# Patient Record
Sex: Female | Born: 1985 | Race: Black or African American | Hispanic: No | Marital: Single | Smoking: Current some day smoker
Health system: Southern US, Community
[De-identification: ages and names within clinical notes are randomized; demographics above are authoritative.]

## PROBLEM LIST (undated history)

## (undated) DIAGNOSIS — F32A Depression, unspecified: Secondary | ICD-10-CM

## (undated) DIAGNOSIS — K219 Gastro-esophageal reflux disease without esophagitis: Secondary | ICD-10-CM

## (undated) DIAGNOSIS — R635 Abnormal weight gain: Secondary | ICD-10-CM

## (undated) DIAGNOSIS — D508 Other iron deficiency anemias: Secondary | ICD-10-CM

## (undated) DIAGNOSIS — R569 Unspecified convulsions: Secondary | ICD-10-CM

## (undated) DIAGNOSIS — R51 Headache: Secondary | ICD-10-CM

## (undated) DIAGNOSIS — R519 Headache, unspecified: Secondary | ICD-10-CM

## (undated) DIAGNOSIS — R011 Cardiac murmur, unspecified: Secondary | ICD-10-CM

## (undated) DIAGNOSIS — B999 Unspecified infectious disease: Secondary | ICD-10-CM

## (undated) DIAGNOSIS — F329 Major depressive disorder, single episode, unspecified: Secondary | ICD-10-CM

## (undated) DIAGNOSIS — D649 Anemia, unspecified: Secondary | ICD-10-CM

## (undated) HISTORY — DX: Cardiac murmur, unspecified: R01.1

## (undated) HISTORY — DX: Other iron deficiency anemias: D50.8

## (undated) HISTORY — DX: Abnormal weight gain: R63.5

## (undated) HISTORY — DX: Anemia, unspecified: D64.9

---
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
## 2005-03-16 ENCOUNTER — Inpatient Hospital Stay (HOSPITAL_COMMUNITY): Admission: AD | Admit: 2005-03-16 | Discharge: 2005-03-16 | Payer: Self-pay | Admitting: Family Medicine

## 2005-05-18 ENCOUNTER — Inpatient Hospital Stay (HOSPITAL_COMMUNITY): Admission: AD | Admit: 2005-05-18 | Discharge: 2005-05-20 | Payer: Self-pay | Admitting: Family Medicine

## 2005-05-18 ENCOUNTER — Ambulatory Visit: Payer: Self-pay | Admitting: Obstetrics and Gynecology

## 2005-05-22 ENCOUNTER — Encounter: Payer: Self-pay | Admitting: Emergency Medicine

## 2005-05-23 ENCOUNTER — Inpatient Hospital Stay (HOSPITAL_COMMUNITY): Admission: AD | Admit: 2005-05-23 | Discharge: 2005-05-24 | Payer: Self-pay | Admitting: Family Medicine

## 2005-05-23 ENCOUNTER — Ambulatory Visit: Payer: Self-pay | Admitting: Family Medicine

## 2005-05-26 ENCOUNTER — Ambulatory Visit (HOSPITAL_COMMUNITY): Admission: RE | Admit: 2005-05-26 | Discharge: 2005-05-26 | Payer: Self-pay | Admitting: Pediatrics

## 2005-05-31 ENCOUNTER — Emergency Department (HOSPITAL_COMMUNITY): Admission: EM | Admit: 2005-05-31 | Discharge: 2005-05-31 | Payer: Self-pay | Admitting: Emergency Medicine

## 2006-04-05 ENCOUNTER — Emergency Department (HOSPITAL_COMMUNITY): Admission: EM | Admit: 2006-04-05 | Discharge: 2006-04-05 | Payer: Self-pay | Admitting: Emergency Medicine

## 2007-04-27 ENCOUNTER — Inpatient Hospital Stay (HOSPITAL_COMMUNITY): Admission: AD | Admit: 2007-04-27 | Discharge: 2007-04-27 | Payer: Self-pay | Admitting: Obstetrics & Gynecology

## 2007-04-27 ENCOUNTER — Ambulatory Visit: Payer: Self-pay | Admitting: Obstetrics & Gynecology

## 2007-05-04 ENCOUNTER — Ambulatory Visit: Payer: Self-pay | Admitting: Obstetrics & Gynecology

## 2007-05-13 ENCOUNTER — Inpatient Hospital Stay (HOSPITAL_COMMUNITY): Admission: AD | Admit: 2007-05-13 | Discharge: 2007-05-15 | Payer: Self-pay | Admitting: Obstetrics & Gynecology

## 2007-05-13 ENCOUNTER — Ambulatory Visit: Payer: Self-pay | Admitting: Family Medicine

## 2007-07-01 ENCOUNTER — Ambulatory Visit: Payer: Self-pay | Admitting: Family Medicine

## 2007-07-01 DIAGNOSIS — D508 Other iron deficiency anemias: Secondary | ICD-10-CM | POA: Insufficient documentation

## 2008-03-09 LAB — CONVERTED CEMR LAB: Pap Smear: NORMAL

## 2008-06-25 ENCOUNTER — Encounter (INDEPENDENT_AMBULATORY_CARE_PROVIDER_SITE_OTHER): Payer: Self-pay | Admitting: *Deleted

## 2008-10-23 ENCOUNTER — Ambulatory Visit: Payer: Self-pay | Admitting: Family Medicine

## 2008-10-23 ENCOUNTER — Encounter: Payer: Self-pay | Admitting: Family Medicine

## 2008-10-23 LAB — CONVERTED CEMR LAB
HCT: 36.4 % (ref 36.0–46.0)
Hemoglobin: 11.4 g/dL — ABNORMAL LOW (ref 12.0–15.0)
MCHC: 31.3 g/dL (ref 30.0–36.0)
MCV: 80.9 fL (ref 78.0–100.0)
RDW: 14.1 % (ref 11.5–15.5)

## 2008-10-25 ENCOUNTER — Encounter: Payer: Self-pay | Admitting: Family Medicine

## 2010-05-07 ENCOUNTER — Emergency Department (HOSPITAL_COMMUNITY)
Admission: EM | Admit: 2010-05-07 | Discharge: 2010-05-07 | Disposition: A | Discharge status: Home / Self Care | Payer: No Typology Code available for payment source | Attending: Emergency Medicine | Admitting: Emergency Medicine

## 2010-05-07 ENCOUNTER — Emergency Department (HOSPITAL_COMMUNITY): Payer: No Typology Code available for payment source

## 2010-05-07 DIAGNOSIS — M545 Low back pain, unspecified: Secondary | ICD-10-CM | POA: Insufficient documentation

## 2010-05-07 DIAGNOSIS — S335XXA Sprain of ligaments of lumbar spine, initial encounter: Secondary | ICD-10-CM | POA: Insufficient documentation

## 2010-05-07 DIAGNOSIS — IMO0002 Reserved for concepts with insufficient information to code with codable children: Secondary | ICD-10-CM | POA: Insufficient documentation

## 2010-05-07 DIAGNOSIS — M546 Pain in thoracic spine: Secondary | ICD-10-CM | POA: Insufficient documentation

## 2010-05-07 DIAGNOSIS — S239XXA Sprain of unspecified parts of thorax, initial encounter: Secondary | ICD-10-CM | POA: Insufficient documentation

## 2010-05-07 DIAGNOSIS — M542 Cervicalgia: Secondary | ICD-10-CM | POA: Insufficient documentation

## 2010-07-25 NOTE — Discharge Summary (Signed)
NAMETOLEEN, Carrie Parker                 ACCOUNT NO.:  [id number]   MEDICAL RECORD NO.:  [id number]          PATIENT TYPE:  INP   LOCATION:  9372                          FACILITY:  [facility]   PHYSICIAN:  Tanya S. Shawnie Pons, M.D.   DATE OF BIRTH:  [birth year]   DATE OF ADMISSION:  05/23/2005  DATE OF DISCHARGE:  05/24/2005                                 DISCHARGE SUMMARY   The patient is a 25 year old P3-0-0-3 who delivered on May 18, 2005.   ADMITTING DIAGNOSES:  1.  Postpartum seizure.  2.  Possible preeclampsia.   DISCHARGE DIAGNOSES:  Epilepsy.  Doubt eclamptic seizure.   HISTORY:  Patient experienced a seizure with loss of consciousness and  incontinence on postpartum day five.  She denied headache, blurry vision,  chest pain, shortness of breath.  She did have a history of having had a  seizure with her first delivery.  She had no allergies.  GYN significant for  history of BV, UTI.  She did have anemia.  No surgeries.  She had an  uncomplicated delivery.  Was discharged from hospital on March 14,  postpartum day two.  Her pressures were normal while she was hospitalized.  On presentation she was afebrile with blood pressures 107-118/70s.  She was  alert, in no distress.  She did have a systolic ejection murmur grade 2.   SIGNIFICANT LABORATORIES:  Urine consistent with postpartum clean catch  specimen.  Hemoglobin 9.4, platelets 249, white count 11.8.  Creatinine was  0.7.  Glucose was 91.  LFTs were normal.  Uric acid was elevated at 7.2.  She had a CT of the head that showed no acute intracranial abnormality, but  significant radiology.   The decision was made to admit the patient for magnesium sulfate as it was  possible that she had had an eclamptic seizure.  She was to have a  neurologic consultation after that.  This was done by phone with Dr. Orlin Hilding  and he was to schedule her for an EEG.  She did have a normal EKG while she  was admitted.  She was to be followed up with  [facility].  Of note,  she is breast-feeding and [facility] should get a copy of the  dictation.  She was diuresing well.  Magnesium was stopped.  She had no  complaints.  She was observed in-house 12 hours off the magnesium and  subsequently discharged home on prenatal vitamins and ibuprofen.  She was  discharged in good condition.      [name], C.N.M.    ______________________________  [name]. Shawnie Pons, M.D.    DP/MEDQ  D:  06/15/2005  T:  06/16/2005  Job:  [id number]

## 2010-07-25 NOTE — H&P (Signed)
NAME:  Carrie Parker, Carrie Parker                 ACCOUNT NO.:  [id number]   MEDICAL RECORD NO.:  [id number]          PATIENT TYPE:  MAT   LOCATION:  MATC                          [facility]ILITY:  [facility]   PHYSICIAN:  Roseanna Rainbow, M.D.DATE OF BIRTH:  [birth year]   DATE OF ADMISSION:  05/22/2004  DATE OF DISCHARGE:                                HISTORY & PHYSICAL   CHIEF COMPLAINT:  The patient is an 25 year old, gravida 2, para 1 with an  estimated date of confinement of May 15, 2004 who presents at 41 weeks  complaining of ruptured membranes and contractions.   HISTORY OF PRESENT ILLNESS:  The patient reports spontaneous rupture of  membranes, clear fluid at 5:30 p.m.  She reports contractions every 5-7  minutes.   ANTEPARTUM COURSE:  Prenatal care with Dr. Gaynell Face.   PREGNANCY RISK FACTORS:  Chlamydia, trichamonas, anemia likely iron  deficiency.   PRENATAL SCREENS:  Hemoglobin 9.7, hematocrit 31.2, platelets 225,000, blood  type A positive, Rh antibody negative, sickle cell trait negative, RPR  nonreactive, rubella immune, hepatitis B surface antigen negative, HIV  nonreactive. PPD negative. Chlamydia positive. One hour GTT 83.   An ultrasound was performed on March 3 which gave a sonographic EDC of May 15, 2004.   PAST OB/GYN HISTORY:  In 2005, she was delivered of a female 7 pounds 7  ounces fullterm, spontaneous vaginal delivery.   PAST MEDICAL HISTORY:  She denies.   PAST SURGICAL HISTORY:  She denies.   FAMILY HISTORY:  Noncontributory.   SOCIAL HISTORY:  No tobacco, ethanol or substance abuse.   ALLERGIES:  No known drug allergies.   MEDICATIONS:  Prenatal vitamins.   PHYSICAL EXAMINATION:  VITAL SIGNS:  Temperature 98.1, pulse 80, respiratory  rate 21, blood pressure 126/74. Fetal heart tracing consistent with fetal  well being, uterine contractions every 5 minutes.  GENERAL:  Well-developed, well-nourished, uncomfortable with uterine  contractions.  STERILE VAGINAL  EXAM:  As per the RN in maternity admissions, the cervix is  2 cm dilated, 80% effaced. Fern and nitrazine positive of the vaginal pool.   ASSESSMENT:  Primipara at 41 weeks latent labor, fetal heart tracing  consistent with fetal well being.   PLAN:  Admission, expectant management.      LAJ/MEDQ  D:  05/22/2004  T:  05/22/2004  Job:  [id number]

## 2010-09-05 ENCOUNTER — Inpatient Hospital Stay (HOSPITAL_COMMUNITY): Payer: Medicaid Other

## 2010-09-05 ENCOUNTER — Inpatient Hospital Stay (HOSPITAL_COMMUNITY)
Admission: AD | Admit: 2010-09-05 | Discharge: 2010-09-05 | Disposition: A | Discharge status: Home / Self Care | Payer: Medicaid Other | Source: Ambulatory Visit | Attending: Family Medicine | Admitting: Family Medicine

## 2010-09-05 DIAGNOSIS — A499 Bacterial infection, unspecified: Secondary | ICD-10-CM | POA: Insufficient documentation

## 2010-09-05 DIAGNOSIS — A5901 Trichomonal vulvovaginitis: Secondary | ICD-10-CM

## 2010-09-05 DIAGNOSIS — O2390 Unspecified genitourinary tract infection in pregnancy, unspecified trimester: Secondary | ICD-10-CM

## 2010-09-05 DIAGNOSIS — O98819 Other maternal infectious and parasitic diseases complicating pregnancy, unspecified trimester: Secondary | ICD-10-CM | POA: Insufficient documentation

## 2010-09-05 DIAGNOSIS — B9689 Other specified bacterial agents as the cause of diseases classified elsewhere: Secondary | ICD-10-CM | POA: Insufficient documentation

## 2010-09-05 DIAGNOSIS — R1032 Left lower quadrant pain: Secondary | ICD-10-CM

## 2010-09-05 DIAGNOSIS — N760 Acute vaginitis: Secondary | ICD-10-CM | POA: Insufficient documentation

## 2010-09-05 LAB — URINALYSIS, ROUTINE W REFLEX MICROSCOPIC
Bilirubin Urine: NEGATIVE
Glucose, UA: NEGATIVE mg/dL
Hgb urine dipstick: NEGATIVE
Ketones, ur: NEGATIVE mg/dL
Nitrite: NEGATIVE
Protein, ur: NEGATIVE mg/dL
Specific Gravity, Urine: 1.025 (ref 1.005–1.030)
Urobilinogen, UA: 0.2 mg/dL (ref 0.0–1.0)
pH: 6 (ref 5.0–8.0)

## 2010-09-05 LAB — URINE MICROSCOPIC-ADD ON

## 2010-09-05 LAB — WET PREP, GENITAL: Yeast Wet Prep HPF POC: NONE SEEN

## 2010-09-05 LAB — GC/CHLAMYDIA PROBE AMP, GENITAL
Chlamydia, DNA Probe: NEGATIVE
GC Probe Amp, Genital: NEGATIVE

## 2010-09-05 LAB — HCG, QUANTITATIVE, PREGNANCY: hCG, Beta Chain, Quant, S: 444 m[IU]/mL — ABNORMAL HIGH (ref <5)

## 2010-11-07 LAB — ABO/RH

## 2010-11-28 LAB — WET PREP, GENITAL
Clue Cells Wet Prep HPF POC: NONE SEEN
Trich, Wet Prep: NONE SEEN
Yeast Wet Prep HPF POC: NONE SEEN

## 2010-11-28 LAB — CBC
Hemoglobin: 9.2 — ABNORMAL LOW
MCHC: 33.8
Platelets: 206
RDW: 15.2

## 2010-11-28 LAB — DIFFERENTIAL
Basophils Absolute: 0
Basophils Relative: 0
Eosinophils Relative: 0
Monocytes Absolute: 0.5
Neutro Abs: 7.2

## 2010-11-28 LAB — HEPATITIS B SURFACE ANTIGEN: Hepatitis B Surface Ag: NEGATIVE

## 2010-11-28 LAB — TYPE AND SCREEN: Antibody Screen: NEGATIVE

## 2010-11-28 LAB — STREP B DNA PROBE: Strep Group B Ag: NEGATIVE

## 2010-11-28 LAB — RAPID URINE DRUG SCREEN, HOSP PERFORMED
Barbiturates: NOT DETECTED
Cocaine: NOT DETECTED
Opiates: NOT DETECTED

## 2010-11-28 LAB — RPR: RPR Ser Ql: NONREACTIVE

## 2010-11-28 LAB — POCT URINALYSIS DIP (DEVICE)
Hgb urine dipstick: NEGATIVE
Ketones, ur: NEGATIVE
Protein, ur: NEGATIVE
Specific Gravity, Urine: 1.015
pH: 7

## 2010-12-01 LAB — CBC
HCT: 31.2 — ABNORMAL LOW
MCHC: 32.6
MCV: 76.6 — ABNORMAL LOW
Platelets: 247
RDW: 16.8 — ABNORMAL HIGH
WBC: 11 — ABNORMAL HIGH

## 2010-12-15 ENCOUNTER — Encounter (HOSPITAL_COMMUNITY): Payer: Self-pay | Admitting: *Deleted

## 2011-01-13 ENCOUNTER — Encounter: Payer: Self-pay | Admitting: *Deleted

## 2011-01-13 ENCOUNTER — Encounter: Payer: Self-pay | Admitting: Cardiovascular Disease

## 2011-01-14 ENCOUNTER — Ambulatory Visit: Payer: No Typology Code available for payment source | Admitting: Cardiovascular Disease

## 2011-01-26 ENCOUNTER — Encounter: Payer: Self-pay | Admitting: Cardiovascular Disease

## 2011-03-05 ENCOUNTER — Encounter: Payer: Self-pay | Admitting: Cardiovascular Disease

## 2011-04-28 ENCOUNTER — Encounter (HOSPITAL_COMMUNITY): Payer: Self-pay | Admitting: Pharmacy Technician

## 2011-05-04 ENCOUNTER — Other Ambulatory Visit: Payer: Self-pay | Admitting: Obstetrics and Gynecology

## 2011-05-05 ENCOUNTER — Encounter (HOSPITAL_COMMUNITY): Payer: Self-pay

## 2011-05-06 ENCOUNTER — Encounter (HOSPITAL_COMMUNITY): Payer: Self-pay

## 2011-05-06 ENCOUNTER — Encounter (HOSPITAL_COMMUNITY)
Admission: RE | Admit: 2011-05-06 | Discharge: 2011-05-06 | Disposition: A | Discharge status: Home / Self Care | Payer: Medicaid Other | Source: Ambulatory Visit | Attending: Obstetrics and Gynecology | Admitting: Obstetrics and Gynecology

## 2011-05-06 HISTORY — DX: Gastro-esophageal reflux disease without esophagitis: K21.9

## 2011-05-06 LAB — CBC
Hemoglobin: 10.4 g/dL — ABNORMAL LOW (ref 12.0–15.0)
MCV: 82 fL (ref 78.0–100.0)
Platelets: 189 10*3/uL (ref 150–400)
RBC: 3.88 MIL/uL (ref 3.87–5.11)
WBC: 7.5 10*3/uL (ref 4.0–10.5)

## 2011-05-06 NOTE — Patient Instructions (Addendum)
3/20 Carrie Parker  05/06/2011   Your procedure is scheduled on:  05/08/11  Enter through the Main Entrance of [facility] at 1:30 PM  Pick up the phone at the desk and dial [phone].   Call this number if you have problems the morning of surgery: [phone]   Remember:   Do not eat food:After Midnight.  Do not drink clear liquids: 4 Hours before arrival.  Take these medicines the morning of surgery with A SIP OF WATER: NA   Do not wear jewelry, make-up or nail polish.  Do not wear lotions, powders, or perfumes. You may wear deodorant.  Do not shave 48 hours prior to surgery.  Do not bring valuables to the hospital.  Contacts, dentures or bridgework may not be worn into surgery.  Leave suitcase in the car. After surgery it may be brought to your room.  For patients admitted to the hospital, checkout time is 11:00 AM the day of discharge.   Patients discharged the day of surgery will not be allowed to drive home.  Name and phone number of your driver: NA  Special Instructions: CHG Shower Use Special Wash: 1/2 bottle night before surgery and 1/2 bottle morning of surgery.   Please read over the following fact sheets that you were given: MRSA Information

## 2011-05-07 ENCOUNTER — Other Ambulatory Visit (HOSPITAL_COMMUNITY): Payer: No Typology Code available for payment source

## 2011-05-07 MED ORDER — CEFAZOLIN SODIUM-DEXTROSE 2-3 GM-% IV SOLR
2.0000 g | INTRAVENOUS | Status: DC
  Filled 2011-05-07: qty 50
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
## 2011-05-08 ENCOUNTER — Ambulatory Visit (HOSPITAL_COMMUNITY)
Admission: RE | Admit: 2011-05-08 | Discharge: 2011-05-08 | Disposition: A | Discharge status: Home / Self Care | Payer: Medicaid Other | Source: Ambulatory Visit | Attending: Obstetrics and Gynecology | Admitting: Obstetrics and Gynecology

## 2011-05-08 ENCOUNTER — Encounter (HOSPITAL_COMMUNITY)
Admission: RE | Disposition: A | Discharge status: Home / Self Care | Payer: Self-pay | Source: Ambulatory Visit | Attending: Obstetrics and Gynecology

## 2011-05-08 ENCOUNTER — Encounter (HOSPITAL_COMMUNITY): Payer: Self-pay | Admitting: *Deleted

## 2011-05-08 DIAGNOSIS — O321XX0 Maternal care for breech presentation, not applicable or unspecified: Secondary | ICD-10-CM | POA: Insufficient documentation

## 2011-05-08 DIAGNOSIS — Z01812 Encounter for preprocedural laboratory examination: Secondary | ICD-10-CM | POA: Insufficient documentation

## 2011-05-08 MED ORDER — SCOPOLAMINE 1 MG/3DAYS TD PT72
MEDICATED_PATCH | TRANSDERMAL | Status: AC
  Filled 2011-05-08: qty 1

## 2011-05-08 NOTE — Progress Notes (Signed)
The patient is 26 year old who presents at term for primary cesarean section because of a breech presentation. She reports that her baby is moving well and that she is having no complications.  BP 119/80  Pulse 92  Temp(Src) 98.6 F (37 C) (Oral)  Resp 16  Ht 5\' 2"  (1.575 m)  Wt 82.101 kg (181 lb)  BMI 33.11 kg/m2  SpO2 99%  Ultrasound:  A single intrauterine gestation is noted. The amniotic fluid volume is normal. There is a posterior grade 2 placenta present. Fetal heart motions are normal. The infant was initially in a breech presentation. However during the ultrasound procedure the infant rotated to a vertex presentation. Fetal heart motions remained stable.  The patient will be discharged to home. She will return to the office in one week for followup examination. We will consider induction of labor at [redacted] weeks gestation. The patient was told to call for questions or concerns.  Mylinda Latina.D.

## 2011-05-08 NOTE — Progress Notes (Signed)
Surgery cancelled per Dr Stefano Gaul. Pt home ambulatory

## 2011-05-12 ENCOUNTER — Encounter (INDEPENDENT_AMBULATORY_CARE_PROVIDER_SITE_OTHER): Payer: Medicaid Other | Admitting: Obstetrics and Gynecology

## 2011-05-12 DIAGNOSIS — Z331 Pregnant state, incidental: Secondary | ICD-10-CM

## 2011-05-12 DIAGNOSIS — D280 Benign neoplasm of vulva: Secondary | ICD-10-CM

## 2011-05-15 ENCOUNTER — Encounter: Payer: Medicaid Other | Admitting: Obstetrics and Gynecology

## 2011-05-15 ENCOUNTER — Encounter (INDEPENDENT_AMBULATORY_CARE_PROVIDER_SITE_OTHER): Payer: Medicaid Other | Admitting: Obstetrics and Gynecology

## 2011-05-15 DIAGNOSIS — Z331 Pregnant state, incidental: Secondary | ICD-10-CM

## 2011-05-17 ENCOUNTER — Encounter (HOSPITAL_COMMUNITY): Payer: Self-pay

## 2011-05-18 ENCOUNTER — Encounter (HOSPITAL_COMMUNITY)
Admission: RE | Disposition: A | Discharge status: Home / Self Care | Payer: Self-pay | Source: Ambulatory Visit | Attending: Obstetrics and Gynecology

## 2011-05-18 ENCOUNTER — Encounter (HOSPITAL_COMMUNITY): Payer: Self-pay | Admitting: Anesthesiology

## 2011-05-18 ENCOUNTER — Inpatient Hospital Stay (HOSPITAL_COMMUNITY): Payer: Medicaid Other | Admitting: Anesthesiology

## 2011-05-18 ENCOUNTER — Other Ambulatory Visit (HOSPITAL_COMMUNITY): Payer: Self-pay | Admitting: Obstetrics and Gynecology

## 2011-05-18 ENCOUNTER — Encounter (HOSPITAL_COMMUNITY): Payer: Self-pay | Admitting: *Deleted

## 2011-05-18 ENCOUNTER — Inpatient Hospital Stay (HOSPITAL_COMMUNITY)
Admission: RE | Admit: 2011-05-18 | Discharge: 2011-05-21 | DRG: 766 | Disposition: A | Discharge status: Home / Self Care | Payer: Medicaid Other | Source: Ambulatory Visit | Attending: Obstetrics and Gynecology | Admitting: Obstetrics and Gynecology

## 2011-05-18 ENCOUNTER — Other Ambulatory Visit: Payer: Self-pay | Admitting: Obstetrics and Gynecology

## 2011-05-18 ENCOUNTER — Encounter (HOSPITAL_COMMUNITY): Payer: Self-pay | Admitting: Neonatology

## 2011-05-18 DIAGNOSIS — O99892 Other specified diseases and conditions complicating childbirth: Secondary | ICD-10-CM | POA: Diagnosis present

## 2011-05-18 DIAGNOSIS — Z302 Encounter for sterilization: Secondary | ICD-10-CM

## 2011-05-18 DIAGNOSIS — A6000 Herpesviral infection of urogenital system, unspecified: Secondary | ICD-10-CM | POA: Diagnosis present

## 2011-05-18 DIAGNOSIS — O34219 Maternal care for unspecified type scar from previous cesarean delivery: Secondary | ICD-10-CM

## 2011-05-18 DIAGNOSIS — Z22330 Carrier of Group B streptococcus: Secondary | ICD-10-CM

## 2011-05-18 DIAGNOSIS — O9903 Anemia complicating the puerperium: Secondary | ICD-10-CM | POA: Diagnosis not present

## 2011-05-18 DIAGNOSIS — O480 Post-term pregnancy: Secondary | ICD-10-CM | POA: Diagnosis present

## 2011-05-18 DIAGNOSIS — O98519 Other viral diseases complicating pregnancy, unspecified trimester: Principal | ICD-10-CM | POA: Diagnosis present

## 2011-05-18 DIAGNOSIS — Z98891 History of uterine scar from previous surgery: Secondary | ICD-10-CM

## 2011-05-18 DIAGNOSIS — D649 Anemia, unspecified: Secondary | ICD-10-CM | POA: Diagnosis not present

## 2011-05-18 SURGERY — Surgical Case
Anesthesia: Spinal | Site: Abdomen | Wound class: Clean Contaminated

## 2011-05-18 MED ORDER — ONDANSETRON HCL 4 MG PO TABS: 4.0000 mg | ORAL_TABLET | ORAL | Status: DC | PRN

## 2011-05-18 MED ORDER — SIMETHICONE 80 MG PO CHEW
80.0000 mg | CHEWABLE_TABLET | ORAL | Status: DC | PRN
  Administered 2011-05-19: 80 mg via ORAL

## 2011-05-18 MED ORDER — SIMETHICONE 80 MG PO CHEW
80.0000 mg | CHEWABLE_TABLET | Freq: Three times a day (TID) | ORAL | Status: DC
  Administered 2011-05-18 – 2011-05-21 (×8): 80 mg via ORAL

## 2011-05-18 MED ORDER — ONDANSETRON HCL 4 MG/2ML IJ SOLN
INTRAMUSCULAR | Status: AC
  Filled 2011-05-18: qty 2

## 2011-05-18 MED ORDER — CEFAZOLIN SODIUM 1-5 GM-% IV SOLN
INTRAVENOUS | Status: AC
  Filled 2011-05-18: qty 100

## 2011-05-18 MED ORDER — NALOXONE HCL 0.4 MG/ML IJ SOLN: 0.4000 mg | INTRAMUSCULAR | Status: DC | PRN

## 2011-05-18 MED ORDER — DIPHENHYDRAMINE HCL 25 MG PO CAPS: 25.0000 mg | ORAL_CAPSULE | Freq: Four times a day (QID) | ORAL | Status: DC | PRN

## 2011-05-18 MED ORDER — CEFAZOLIN SODIUM-DEXTROSE 2-3 GM-% IV SOLR
2.0000 g | Freq: Once | INTRAVENOUS | Status: DC
  Filled 2011-05-18: qty 50

## 2011-05-18 MED ORDER — IBUPROFEN 600 MG PO TABS
600.0000 mg | ORAL_TABLET | Freq: Four times a day (QID) | ORAL | Status: DC
  Administered 2011-05-18 – 2011-05-21 (×11): 600 mg via ORAL
  Filled 2011-05-18 (×7): qty 1

## 2011-05-18 MED ORDER — DIPHENHYDRAMINE HCL 50 MG/ML IJ SOLN: 12.5000 mg | INTRAMUSCULAR | Status: DC | PRN

## 2011-05-18 MED ORDER — NALBUPHINE HCL 10 MG/ML IJ SOLN: 5.0000 mg | INTRAMUSCULAR | Status: DC | PRN

## 2011-05-18 MED ORDER — DIPHENHYDRAMINE HCL 25 MG PO CAPS: 25.0000 mg | ORAL_CAPSULE | ORAL | Status: DC | PRN

## 2011-05-18 MED ORDER — OXYTOCIN 20 UNITS IN LACTATED RINGERS INFUSION - SIMPLE
INTRAVENOUS | Status: DC | PRN
  Administered 2011-05-18 (×2): 20 [IU] via INTRAVENOUS

## 2011-05-18 MED ORDER — ZOLPIDEM TARTRATE 5 MG PO TABS: 5.0000 mg | ORAL_TABLET | Freq: Every evening | ORAL | Status: DC | PRN

## 2011-05-18 MED ORDER — ONDANSETRON HCL 4 MG/2ML IJ SOLN: 4.0000 mg | INTRAMUSCULAR | Status: DC | PRN

## 2011-05-18 MED ORDER — IBUPROFEN 600 MG PO TABS
600.0000 mg | ORAL_TABLET | Freq: Four times a day (QID) | ORAL | Status: DC | PRN
  Filled 2011-05-18 (×6): qty 1

## 2011-05-18 MED ORDER — ONDANSETRON HCL 4 MG/2ML IJ SOLN: 4.0000 mg | Freq: Three times a day (TID) | INTRAMUSCULAR | Status: DC | PRN

## 2011-05-18 MED ORDER — OXYTOCIN 10 UNIT/ML IJ SOLN
INTRAMUSCULAR | Status: AC
  Filled 2011-05-18: qty 2

## 2011-05-18 MED ORDER — FENTANYL CITRATE 0.05 MG/ML IJ SOLN
25.0000 ug | INTRAMUSCULAR | Status: DC | PRN
  Administered 2011-05-18 (×2): 50 ug via INTRAVENOUS

## 2011-05-18 MED ORDER — EPHEDRINE SULFATE 50 MG/ML IJ SOLN
INTRAMUSCULAR | Status: DC | PRN
  Administered 2011-05-18 (×2): 10 mg via INTRAVENOUS

## 2011-05-18 MED ORDER — DIBUCAINE 1 % RE OINT: 1.0000 "application " | TOPICAL_OINTMENT | RECTAL | Status: DC | PRN

## 2011-05-18 MED ORDER — ACETAMINOPHEN 325 MG PO TABS: 325.0000 mg | ORAL_TABLET | ORAL | Status: DC | PRN

## 2011-05-18 MED ORDER — DIPHENHYDRAMINE HCL 50 MG/ML IJ SOLN: 25.0000 mg | INTRAMUSCULAR | Status: DC | PRN

## 2011-05-18 MED ORDER — EPHEDRINE 5 MG/ML INJ
INTRAVENOUS | Status: AC
  Filled 2011-05-18: qty 10

## 2011-05-18 MED ORDER — ACETAMINOPHEN 10 MG/ML IV SOLN: 1000.0000 mg | Freq: Four times a day (QID) | INTRAVENOUS | Status: AC | PRN

## 2011-05-18 MED ORDER — LACTATED RINGERS IV SOLN
INTRAVENOUS | Status: DC
  Administered 2011-05-18: 22:00:00 via INTRAVENOUS

## 2011-05-18 MED ORDER — METOCLOPRAMIDE HCL 5 MG/ML IJ SOLN: 10.0000 mg | Freq: Three times a day (TID) | INTRAMUSCULAR | Status: DC | PRN

## 2011-05-18 MED ORDER — MENTHOL 3 MG MT LOZG: 1.0000 | LOZENGE | OROMUCOSAL | Status: DC | PRN

## 2011-05-18 MED ORDER — ONDANSETRON HCL 4 MG/2ML IJ SOLN
INTRAMUSCULAR | Status: DC | PRN
  Administered 2011-05-18: 4 mg via INTRAVENOUS

## 2011-05-18 MED ORDER — FENTANYL CITRATE 0.05 MG/ML IJ SOLN
INTRAMUSCULAR | Status: AC
  Filled 2011-05-18: qty 2

## 2011-05-18 MED ORDER — KETOROLAC TROMETHAMINE 30 MG/ML IJ SOLN: 30.0000 mg | Freq: Four times a day (QID) | INTRAMUSCULAR | Status: AC | PRN

## 2011-05-18 MED ORDER — SENNOSIDES-DOCUSATE SODIUM 8.6-50 MG PO TABS
2.0000 | ORAL_TABLET | Freq: Every day | ORAL | Status: DC
  Administered 2011-05-18 – 2011-05-20 (×3): 2 via ORAL

## 2011-05-18 MED ORDER — TETANUS-DIPHTH-ACELL PERTUSSIS 5-2.5-18.5 LF-MCG/0.5 IM SUSP: 0.5000 mL | Freq: Once | INTRAMUSCULAR | Status: DC

## 2011-05-18 MED ORDER — SCOPOLAMINE 1 MG/3DAYS TD PT72
1.0000 | MEDICATED_PATCH | Freq: Once | TRANSDERMAL | Status: DC
  Administered 2011-05-18: 1.5 mg via TRANSDERMAL

## 2011-05-18 MED ORDER — KETOROLAC TROMETHAMINE 30 MG/ML IJ SOLN
30.0000 mg | Freq: Four times a day (QID) | INTRAMUSCULAR | Status: AC | PRN
  Administered 2011-05-18: 30 mg via INTRAMUSCULAR

## 2011-05-18 MED ORDER — SODIUM CHLORIDE 0.9 % IV SOLN: 1.0000 ug/kg/h | INTRAVENOUS | Status: DC | PRN

## 2011-05-18 MED ORDER — PRENATAL MULTIVITAMIN CH
1.0000 | ORAL_TABLET | Freq: Every day | ORAL | Status: DC
  Administered 2011-05-20 – 2011-05-21 (×2): 1 via ORAL
  Filled 2011-05-18 (×2): qty 1

## 2011-05-18 MED ORDER — LANOLIN HYDROUS EX OINT: 1.0000 "application " | TOPICAL_OINTMENT | CUTANEOUS | Status: DC | PRN

## 2011-05-18 MED ORDER — BUPIVACAINE IN DEXTROSE 0.75-8.25 % IT SOLN
INTRATHECAL | Status: DC | PRN
  Administered 2011-05-18: 11 mg via INTRATHECAL

## 2011-05-18 MED ORDER — PHENYLEPHRINE 40 MCG/ML (10ML) SYRINGE FOR IV PUSH (FOR BLOOD PRESSURE SUPPORT)
PREFILLED_SYRINGE | INTRAVENOUS | Status: AC
  Filled 2011-05-18: qty 5

## 2011-05-18 MED ORDER — OXYTOCIN 20 UNITS IN LACTATED RINGERS INFUSION - SIMPLE: 125.0000 mL/h | INTRAVENOUS | Status: AC

## 2011-05-18 MED ORDER — WITCH HAZEL-GLYCERIN EX PADS: 1.0000 "application " | MEDICATED_PAD | CUTANEOUS | Status: DC | PRN

## 2011-05-18 MED ORDER — LACTATED RINGERS IV SOLN
INTRAVENOUS | Status: DC
  Administered 2011-05-18 (×4): via INTRAVENOUS

## 2011-05-18 MED ORDER — MEPERIDINE HCL 25 MG/ML IJ SOLN: 6.2500 mg | INTRAMUSCULAR | Status: DC | PRN

## 2011-05-18 MED ORDER — OXYCODONE-ACETAMINOPHEN 5-325 MG PO TABS
1.0000 | ORAL_TABLET | ORAL | Status: DC | PRN
  Administered 2011-05-18 – 2011-05-20 (×5): 2 via ORAL
  Administered 2011-05-20 – 2011-05-21 (×5): 1 via ORAL
  Filled 2011-05-18 (×2): qty 1
  Filled 2011-05-18 (×2): qty 2
  Filled 2011-05-18 (×2): qty 1
  Filled 2011-05-18 (×2): qty 2
  Filled 2011-05-18: qty 1
  Filled 2011-05-18: qty 2

## 2011-05-18 MED ORDER — SCOPOLAMINE 1 MG/3DAYS TD PT72: 1.0000 | MEDICATED_PATCH | Freq: Once | TRANSDERMAL | Status: DC

## 2011-05-18 MED ORDER — PHENYLEPHRINE HCL 10 MG/ML IJ SOLN
INTRAMUSCULAR | Status: DC | PRN
  Administered 2011-05-18 (×3): 80 ug via INTRAVENOUS
  Administered 2011-05-18: 40 ug via INTRAVENOUS

## 2011-05-18 MED ORDER — SODIUM CHLORIDE 0.9 % IJ SOLN: 3.0000 mL | INTRAMUSCULAR | Status: DC | PRN

## 2011-05-18 MED ORDER — KETOROLAC TROMETHAMINE 30 MG/ML IJ SOLN
INTRAMUSCULAR | Status: AC
  Filled 2011-05-18: qty 1

## 2011-05-18 SURGICAL SUPPLY — 32 items
BENZOIN TINCTURE PRP APPL 2/3 (GAUZE/BANDAGES/DRESSINGS) ×2 IMPLANT
CHLORAPREP W/TINT 26ML (MISCELLANEOUS) ×2 IMPLANT
CLOTH BEACON ORANGE TIMEOUT ST (SAFETY) ×2 IMPLANT
CONTAINER PREFILL 10% NBF 15ML (MISCELLANEOUS) IMPLANT
DRSG COVADERM 4X8 (GAUZE/BANDAGES/DRESSINGS) ×2 IMPLANT
ELECT REM PT RETURN 9FT ADLT (ELECTROSURGICAL) ×2
ELECTRODE REM PT RTRN 9FT ADLT (ELECTROSURGICAL) ×1 IMPLANT
EXTRACTOR VACUUM M CUP 4 TUBE (SUCTIONS) IMPLANT
GLOVE BIO SURGEON STRL SZ7.5 (GLOVE) ×4 IMPLANT
GLOVE BIOGEL PI IND STRL 7.5 (GLOVE) ×1 IMPLANT
GLOVE BIOGEL PI INDICATOR 7.5 (GLOVE) ×1
GOWN PREVENTION PLUS LG XLONG (DISPOSABLE) ×6 IMPLANT
KIT ABG SYR 3ML LUER SLIP (SYRINGE) IMPLANT
NEEDLE HYPO 22GX1.5 SAFETY (NEEDLE) IMPLANT
NEEDLE HYPO 25X5/8 SAFETYGLIDE (NEEDLE) IMPLANT
NS IRRIG 1000ML POUR BTL (IV SOLUTION) ×2 IMPLANT
PACK C SECTION WH (CUSTOM PROCEDURE TRAY) ×2 IMPLANT
RETRACTOR WND ALEXIS 25 LRG (MISCELLANEOUS) ×1 IMPLANT
RTRCTR WOUND ALEXIS 25CM LRG (MISCELLANEOUS) ×2
SLEEVE SCD COMPRESS KNEE MED (MISCELLANEOUS) IMPLANT
STRIP CLOSURE SKIN 1/2X4 (GAUZE/BANDAGES/DRESSINGS) ×2 IMPLANT
SUT CHROMIC 2 0 CT 1 (SUTURE) ×2 IMPLANT
SUT MNCRL AB 3-0 PS2 27 (SUTURE) ×2 IMPLANT
SUT PLAIN 0 NONE (SUTURE) ×2 IMPLANT
SUT PLAIN 2 0 XLH (SUTURE) ×2 IMPLANT
SUT VIC AB 0 CT1 36 (SUTURE) ×2 IMPLANT
SUT VIC AB 0 CTX 36 (SUTURE) ×3
SUT VIC AB 0 CTX36XBRD ANBCTRL (SUTURE) ×3 IMPLANT
SYR CONTROL 10ML LL (SYRINGE) IMPLANT
TOWEL OR 17X24 6PK STRL BLUE (TOWEL DISPOSABLE) ×4 IMPLANT
TRAY FOLEY CATH 14FR (SET/KITS/TRAYS/PACK) ×2 IMPLANT
WATER STERILE IRR 1000ML POUR (IV SOLUTION) ×2 IMPLANT

## 2011-05-18 NOTE — Anesthesia Preprocedure Evaluation (Addendum)
Anesthesia Evaluation  Patient identified by MRN, date of birth, ID band Patient awake    Reviewed: Allergy & Precautions, H&P , NPO status , Patient's Chart, lab work & pertinent test results  Airway Mallampati: II      Dental No notable dental hx.    Pulmonary neg pulmonary ROS,  breath sounds clear to auscultation  Pulmonary exam normal       Cardiovascular Exercise Tolerance: Good negative cardio ROS  + Valvular Problems/Murmurs Rhythm:regular Rate:Normal     Neuro/Psych Seizures -,  negative neurological ROS  negative psych ROS   GI/Hepatic negative GI ROS, Neg liver ROS, GERD-  ,  Endo/Other  negative endocrine ROS  Renal/GU negative Renal ROS  negative genitourinary   Musculoskeletal   Abdominal Normal abdominal exam  (+)   Peds  Hematology negative hematology ROS (+)   Anesthesia Other Findings ANEMIA, IRON DEFICIENCY, MICROCYTIC     Contraceptive management        Weight gain     Anemia        Heart murmur   no cardiologist, no testing, just when pregnant- benign murmur GERD (gastroesophageal reflux disease)  No seizures since 2007- no meds necessary    Reproductive/Obstetrics (+) Pregnancy                           Anesthesia Physical Anesthesia Plan  ASA: II  Anesthesia Plan: Spinal   Post-op Pain Management:    Induction:   Airway Management Planned:   Additional Equipment:   Intra-op Plan:   Post-operative Plan:   Informed Consent: I have reviewed the patients History and Physical, chart, labs and discussed the procedure including the risks, benefits and alternatives for the proposed anesthesia with the patient or authorized representative who has indicated his/her understanding and acceptance.     Plan Discussed with: Anesthesiologist, CRNA and Surgeon  Anesthesia Plan Comments:         Anesthesia Quick Evaluation

## 2011-05-18 NOTE — Transfer of Care (Signed)
Immediate Anesthesia Transfer of Care Note  Patient: Carrie Parker  Procedure(s) Performed: Procedure(s) (LRB): CESAREAN SECTION (N/A)  Patient Location: PACU  Anesthesia Type: Spinal  Level of Consciousness: awake, alert  and oriented  Airway & Oxygen Therapy: Patient Spontanous Breathing  Post-op Assessment: Report given to PACU RN and Post -op Vital signs reviewed and stable  Post vital signs: Reviewed and stable  Complications: No apparent anesthesia complications

## 2011-05-18 NOTE — Anesthesia Postprocedure Evaluation (Signed)
Anesthesia Post Note  Patient: Carrie Parker  Procedure(s) Performed: Procedure(s) (LRB): CESAREAN SECTION (N/A)  Anesthesia type: Spinal  Patient location: PACU  Post pain: Pain level controlled  Post assessment: Post-op Vital signs reviewed  Last Vitals:  Filed Vitals:   05/18/11 1520  BP: 108/60  Pulse: 80  Temp: 36.4 C  Resp: 20    Post vital signs: Reviewed  Level of consciousness: awake  Complications: No apparent anesthesia complications

## 2011-05-18 NOTE — Anesthesia Procedure Notes (Signed)
Spinal  Patient location during procedure: OR Start time: 05/18/2011 2:12 PM Staffing Anesthesiologist: Brayton Caves R Performed by: anesthesiologist  Preanesthetic Checklist Completed: patient identified, site marked, surgical consent, pre-op evaluation, timeout performed, IV checked, risks and benefits discussed and monitors and equipment checked Spinal Block Patient position: sitting Prep: DuraPrep Patient monitoring: heart rate, cardiac monitor, continuous pulse ox and blood pressure Approach: midline Location: L3-4 Injection technique: single-shot Needle Needle type: Sprotte  Needle gauge: 24 G Needle length: 9 cm Assessment Sensory level: T4 Additional Notes Patient identified.  Risk benefits discussed including failed block, incomplete pain control, headache, nerve damage, paralysis, blood pressure changes, nausea, vomiting, reactions to medication both toxic or allergic, and postpartum back pain.  Patient expressed understanding and wished to proceed.  All questions were answered.  Sterile technique used throughout procedure.  CSF was clear.  No parasthesia or other complications.  Please see nursing notes for vital signs.

## 2011-05-18 NOTE — Op Note (Addendum)
Cesarean Section Procedure Note  Indications: 1.HSV 2 Outbreak 2. Postdates 3. Desires Sterilization  Pre-operative Diagnosis: herpes breakout;postdate;sterilization   Post-operative Diagnosis: herpes breakout;postdate;sterilization  Procedure: CESAREAN SECTION and BILATERAL TUBAL LIGATION  Surgeon: Purcell Nails, MD    Assistants: Lavera Guise, CNM  Anesthesia: Spinal  Anesthesiologist: No responsible anesthesiologist has been recorded for the case.   Procedure Details  The patient was taken to the operating room after the risks, benefits, complications, treatment options, and expected outcomes were discussed with the patient.  The patient concurred with the proposed plan, giving informed consent which was signed and witnessed. The patient was taken to Operating Room 1, identified as Carrie Parker and the procedure verified as C-Section Delivery. A Time Out was held and the above information confirmed.  After induction of anesthesia by obtaining a surgical level via the spinal, the patient was prepped and draped in the usual sterile manner. A Pfannenstiel skin incision was made and carried down through the subcutaneous tissue to the underlying layer of fascia.  The fascia was incised bilaterally and extended transversely bilaterally with the Mayo scissors. Kocher clamps were placed on the inferior aspect of the fascial incision and the underlying rectus muscle was separated from the fascia. The same was done on the superior aspect of the fascial incision.  The peritoneum was identified, entered bluntly and extended manually. The utero-vesical peritoneal reflection was incised transversely and the bladder flap was bluntly freed from the lower uterine segment. A low transverse uterine incision was made with the scalpel and extended bilaterally with the bandage scissors.  The infant was delivered in vertex position without difficulty. After the umbilical cord was clamped and cut, the infant  was handed to the awaiting pediatricians.  Cord blood was obtained for evaluation.  The placenta was removed intact and appeared to be within normal limits. The uterus was cleared of all clots and debris. The uterine incision was closed with running interlocking sutures of 0 Vicryl and a second imbricating layer was performed as well.   Bilateral tubes and ovaries appeared to be within normal limits.  Good hemostasis was noted.  Copious irrigation was performed until clear.  The uterus was exteriorized and the left fallopian tube grasped in the midportion with a babcock after carrying it out to its fimbriated end and ligated with two 2-0 plain ties.  The tube was excised and the remaining pedicle cauterized with the bovie. The same was done on the contralateral side.  The peritoneum was repaired with 2-0 chromic via a running suture.  The fascia was reapproximated with a running suture of 0 Vicryl. The subcutaneous tissue was reapproximated with 3 interrupted sutures of 2-0 plain.  The skin was reapproximated with a subcuticular suture of 3-0 monocryl.  Steristrips were applied.  Instrument, sponge, and needle counts were correct prior to abdominal closure and at the conclusion of the case.  The patient was awaiting transfer to the recovery room in good condition.  Findings: Live female infant with Apgars 9 at one minute and 10 at five minutes.  Normal appearing bilateral ovaries and fallopian tubes were noted.  Estimated Blood Loss:  900 ml         Drains: foley to gravity 100 ml         Total IV Fluids: 3100 ml         Specimens to Pathology: Placenta and Bilateral Portion of Tubes         Complications:  None; patient tolerated the  procedure well.         Disposition: PACU - hemodynamically stable.         Condition: stable  Attending Attestation: I performed the procedure.

## 2011-05-18 NOTE — Consult Note (Signed)
Requested to attend primary C/S for breech presentation and h/o HSV (? Active). At delivery infant in vertex and was manually extracted with spontaneous cries and active tone. Given tactile stimulation with drying and bulb suction to naso/oropharynx. No dysmorphic features. Voided x 2.    Care deferred to attendant RN from Nursery or L/D and to assigned pediatrician.    Dagoberto Ligas MD [facility]
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
## 2011-05-19 ENCOUNTER — Encounter (HOSPITAL_COMMUNITY): Payer: Self-pay | Admitting: Obstetrics and Gynecology

## 2011-05-19 ENCOUNTER — Encounter: Payer: Self-pay | Admitting: Cardiovascular Disease

## 2011-05-19 LAB — CBC
MCH: 26.8 pg (ref 26.0–34.0)
Platelets: 159 10*3/uL (ref 150–400)
RBC: 3.1 MIL/uL — ABNORMAL LOW (ref 3.87–5.11)
RDW: 14.8 % (ref 11.5–15.5)
WBC: 12.6 10*3/uL — ABNORMAL HIGH (ref 4.0–10.5)

## 2011-05-19 MED ORDER — POLYSACCHARIDE IRON COMPLEX 150 MG PO CAPS
150.0000 mg | ORAL_CAPSULE | Freq: Every day | ORAL | Status: DC
  Administered 2011-05-19 – 2011-05-21 (×3): 150 mg via ORAL
  Filled 2011-05-19 (×4): qty 1

## 2011-05-19 NOTE — Anesthesia Postprocedure Evaluation (Signed)
  Anesthesia Post-op Note  Patient: Carrie Parker  Procedure(s) Performed: Procedure(s) (LRB): CESAREAN SECTION (N/A)  Patient Location: Mother/Baby  Anesthesia Type: Spinal  Level of Consciousness: awake  Airway and Oxygen Therapy: Patient Spontanous Breathing  Post-op Pain: none  Post-op Assessment: Post-op Vital signs reviewed, Patient's Cardiovascular Status Stable, Respiratory Function Stable, No signs of Nausea or vomiting, Adequate PO intake, Pain level controlled, No headache, No backache, No residual numbness and No residual motor weakness  Post-op Vital Signs: Reviewed and stable  Complications: No apparent anesthesia complications

## 2011-05-19 NOTE — Progress Notes (Signed)
UR chart review completed.  

## 2011-05-19 NOTE — Progress Notes (Signed)
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  Up ad lib, denies syncope or dizziness.  Bottlefeeding.  Objective: Vital signs in last 24 hours: Temp:  [97.5 F (36.4 C)-99.1 F (37.3 C)] 98.2 F (36.8 C) (03/12 0545) Pulse Rate:  [65-82] 69  (03/12 0545) Resp:  [16-23] 16  (03/12 0545) BP: (98-116)/(57-72) 98/60 mmHg (03/12 0545) SpO2:  [98 %-100 %] 98 % (03/12 0545) Weight:  [82.101 kg (181 lb)] 82.101 kg (181 lb) (03/11 1700)  Physical Exam:  General: alert Lochia: appropriate Uterine Fundus: firm Incision: healing well DVT Evaluation: No evidence of DVT seen on physical exam. Negative Homan's sign.   Basename 05/19/11 0530  HGB 8.3*  HCT 25.4*    Assessment/Plan: Status post Cesarean section. Doing well postoperatively.  Continue current care. Check orthostatics FeSO4 q day.  Nigel Bridgeman 05/19/2011, 2:01 PM

## 2011-05-19 NOTE — Addendum Note (Signed)
Addendum  created 05/19/11 0757 by Suella Grove, CRNA   Modules edited:Notes Section

## 2011-05-20 DIAGNOSIS — Z98891 History of uterine scar from previous surgery: Secondary | ICD-10-CM

## 2011-05-20 NOTE — Progress Notes (Addendum)
Patient ID: Carrie Parker, female   DOB: January 01, 1986, 26 y.o.   MRN: [id number] Subjective: Postpartum Day 2: Cesarean Delivery with BTL Patient reports tolerating PO and no problems voiding.  C/o gas, using kpad and enc moving no complaints, up ad lib without syncope Pain well controlled with po meds Bottle feeding Mood stable, bonding well   Objective: Vital signs in last 24 hours: Temp:  [97.6 F (36.4 C)-98.2 F (36.8 C)] 98.2 F (36.8 C) (03/13 0549) Pulse Rate:  [65-91] 84  (03/13 0549) Resp:  [18] 18  (03/13 0549) BP: (97-110)/(53-72) 110/53 mmHg (03/13 0549)  Physical Exam:  General: alert and no distress Breasts: filling Heart: RRR Lungs: CTAB Abdomen: BS x4 Uterine Fundus: firm Incision: healing well, steri strips on, some dried blood, but intact Lochia: appropriate DVT Evaluation: No evidence of DVT seen on physical exam. Negative Homan's sign. No significant calf/ankle edema.   Basename 05/19/11 0530  HGB 8.3*  HCT 25.4*    Assessment/Plan: Status post Cesarean section. Doing well postoperatively.  Anemia - stable Hx PPD - stable Continue current care. Plan D/C home tmrw  LILLARD,SHELLEY M 05/20/2011, 9:30 AM

## 2011-05-21 MED ORDER — OXYCODONE-ACETAMINOPHEN 5-325 MG PO TABS: 1.0000 | ORAL_TABLET | ORAL | Status: AC | PRN

## 2011-05-21 MED ORDER — POLYSACCHARIDE IRON COMPLEX 150 MG PO CAPS: 150.0000 mg | ORAL_CAPSULE | Freq: Every day | ORAL | Status: AC

## 2011-05-21 MED ORDER — IBUPROFEN 600 MG PO TABS
600.0000 mg | ORAL_TABLET | Freq: Four times a day (QID) | ORAL | Status: AC | PRN
Start: 2011-05-21 — End: 2011-05-31

## 2011-05-21 MED ORDER — VALACYCLOVIR HCL 500 MG PO TABS: 500.0000 mg | ORAL_TABLET | Freq: Every morning | ORAL | Status: AC

## 2011-05-21 NOTE — Discharge Instructions (Addendum)
Continue taking valtrex daily for 2 weeks.  Cesarean Delivery Care After Refer to this sheet in the next few weeks. These instructions provide you with information on caring for yourself after your procedure. Your caregiver may also give you more specific instructions. Your treatment has been planned according to current medical practices, but problems sometimes occur. Call your caregiver if you have any problems or questions after your procedure. HOME CARE INSTRUCTIONS Healing will take time. You will have discomfort, tenderness, swelling, and bruising at the surgery site for a couple of weeks. This is normal and will get better as time goes on. Activity  Rest as much as possible the first 2 weeks.   When possible, have someone help you with your household activities and your baby for 2 to 3 weeks.   Limit your housework and social activity. Increase your activity gradually as your strength returns.   Do not climb stairs more than 2 to 3 times a day.   Do not lift anything heavier than your baby.   Follow your caregiver's instructions about driving a car.   Exercise only as directed by your caregiver.  Nutrition  You may return to your usual diet. Eat a well-balanced diet.   Drink enough fluid to keep your urine clear or pale yellow.   Keep taking your prenatal or multivitamins.   Do not drink alcohol until your caregiver says it is okay.  Elimination You should return to your usual bowel function. If you develop constipation, ask your caregiver about taking a mild laxative that will help you go to the bathroom. Bran foods and fluids help with constipation. Gradually add fruit, vegetables, and bran to your diet.  Hygiene  You may shower, wash your hair, and take tub baths unless your caregiver tells you otherwise.   Continue perineal care until your vaginal bleeding and discharge stops.   Do not douche or use tampons until your caregiver says it is okay.  Fever If you feel  feverish or have shaking chills, take your temperature. The fever may indicate infection. Infections can be treated with antibiotic medicine. Pain Control and Medicine  Only take over-the-counter or prescription medicine as directed by your caregiver. Do not take aspirin. It can cause bleeding.   Do not drive when taking pain medicine.   Talk to your caregiver about restarting or adjusting your normal medicines.  Incision Care  Clean your cut (incision) gently with soap and water, then pat dry.   If your caregiver says it is okay, leave the incision without a bandage (dressing) unless it is draining fluid or irritated.   If you have small adhesive strips across the incision and they do not fall off within 7 days, carefully peel them off.   Check the incision daily for increased redness, drainage, swelling, or separation of skin.   Hug a pillow when coughing or sneezing. This helps to relieve pain.  Vaginal Care You may have a vaginal discharge or bleeding for up to 6 weeks. If the vaginal discharge becomes bright red, bad smelling, heavy in amount, has blood clots, or if you have burning or frequent urination, call your caregiver.If your bleeding slows down and then gets heavier, your body is telling you to slow down and relax more. Sexual Intercourse  Check with your caregiver before resuming sexual activity. Often, after 4 to 6 weeks, if you feel good and are well rested, sexual activity may be resumed. Avoid positions that strain the incision site.   You  can become pregnant before you have a period. If you decide to have sexual intercourse, use birth control if you do not want to become pregnant right away.  Health Practices  Keep all your postpartum appointments as recommended by your caregiver. Generally, your caregiver will want to see you in 2 to 3 weeks.   Continue with your yearly pelvic exams.   Continue monthly self-breast exams and yearly physical exams with a Pap test.    Breast Care  If you are not breastfeeding and your breasts become tender, hard, or leak milk, you may wear a tight-fitting bra and apply ice to your breasts.   If you are breastfeeding, wear a good support bra.   Call your caregiver if you have breast pain, flu-like symptoms, fever, or hardness and reddening of your breasts.  Postpartum Blues You may have a period of low spirits or "blues" after your baby is born. Discuss your feelings with your partner, family, and friends. This may be caused by the changing hormone levels in your body. You may want to contact your caregiver if this is worrisome. Miscellaneous  Limit wearing support panties or control-top hose.   If you breastfeed, you may not have a period for several months or longer. This is normal for the nursing mother. If you do not menstruate within 6 weeks after you stop breastfeeding, see your caregiver.   If you are not breastfeeding, you can expect to menstruate within 6 to 10 weeks after birth. If you have not started by the 11th week, check with your caregiver.  SEEK MEDICAL CARE IF:   There is swelling, redness, or increasing pain in the wound area.   You have pus coming from the wound.   You notice a bad smell from the wound or surgical dressing.   You have pain, redness, and swelling from the intravenous (IV) site.   Your wound breaks open (the edges are not staying together).   You feel dizzy or feel like fainting.   You develop pain or bleeding when you urinate.   You develop diarrhea.   You develop nausea and vomiting.   You develop abnormal vaginal discharge.   You develop a rash.   You have any type of abnormal reaction or develop an allergy to your medicine.   Your pain is not relieved by your medicine or becomes worse.   Your temperature is 101 F (38.3 C), or is 100.4 F (38 C) taken 2 times in a 4 hour period.  SEEK IMMEDIATE MEDICAL CARE:  You develop a temperature of 102 F (38.9 C) or  higher.   You develop abdominal pain.   You develop chest pain.   You develop shortness of breath.   You faint.   You develop pain, swelling, or redness of your leg.   You develop heavy vaginal bleeding with or without blood clots.  Document Released: 11/15/2001 Document Revised: 02/12/2011 Document Reviewed: 05/21/2010 [facility] Patient Information 2012 [place], [place].  Iron-Rich Diet An iron-rich diet contains foods that are good sources of iron. Iron is an important mineral that helps your body produce hemoglobin. Hemoglobin is a protein in red blood cells that carries oxygen to the body's tissues. Sometimes, the iron level in your blood can be low. This may be caused by:  A lack of iron in your diet.   Blood loss.   Times of growth, such as during pregnancy or during a child's growth and development.  Low levels of iron can cause  a decrease in the number of red blood cells. This can result in iron deficiency anemia. Iron deficiency anemia symptoms include:  Tiredness.   Weakness.   Irritability.   Increased chance of infection.  Here are some recommendations for daily iron intake:  Males older than 26 years of age need 8 mg of iron per day.   Women ages 92 to 31 need 18 mg of iron per day.   Pregnant women need 27 mg of iron per day, and women who are over 36 years of age and breastfeeding need 9 mg of iron per day.   Women over the age of 80 need 8 mg of iron per day.  SOURCES OF IRON There are 2 types of iron that are found in food: heme iron and nonheme iron. Heme iron is absorbed by the body better than nonheme iron. Heme iron is found in meat, poultry, and fish. Nonheme iron is found in grains, beans, and vegetables. Heme Iron Sources Food / Iron (mg)  Chicken liver, 3 oz (85 g)/ 10 mg   Beef liver, 3 oz (85 g)/ 5.5 mg   Oysters, 3 oz (85 g)/ 8 mg   Beef, 3 oz (85 g)/ 2 to 3 mg   Shrimp, 3 oz (85 g)/ 2.8 mg   [place], 3 oz (85 g)/ 2 mg   Chicken, 3  oz (85 g) / 1 mg   Fish (tuna, halibut), 3 oz (85 g)/ 1 mg   Pork, 3 oz (85 g)/ 0.9 mg  Nonheme Iron Sources Food / Iron (mg)  Ready-to-eat breakfast cereal, iron-fortified / 3.9 to 7 mg   Tofu,  cup / 3.4 mg   Kidney beans,  cup / 2.6 mg   Baked potato with skin / 2.7 mg   Asparagus,  cup / 2.2 mg   Avocado / 2 mg   Dried peaches,  cup / 1.6 mg   Raisins,  cup / 1.5 mg   Soy milk, 1 cup / 1.5 mg   Whole-wheat bread, 1 slice / 1.2 mg   Spinach, 1 cup / 0.8 mg   Broccoli,  cup / 0.6 mg  IRON ABSORPTION Certain foods can decrease the body's absorption of iron. Try to avoid these foods and beverages while eating meals with iron-containing foods:  Coffee.   Tea.   Fiber.   Soy.  Foods containing vitamin C can help increase the amount of iron your body absorbs from iron sources, especially from nonheme sources. Eat foods with vitamin C along with iron-containing foods to increase your iron absorption. Foods that are high in vitamin C include many fruits and vegetables. Some good sources are:  Fresh orange juice.   Oranges.   Strawberries.   Mangoes.   Grapefruit.   Red bell peppers.   Green bell peppers.   Broccoli.   Potatoes with skin.   Tomato juice.  Document Released: 10/07/2004 Document Revised: 02/12/2011 Document Reviewed: 08/14/2010 [facility] Patient Information 2012 [place], [place].

## 2011-05-21 NOTE — Discharge Summary (Signed)
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 Prenatal Procedures: ultrasound Intrapartum Procedures: cesarean: low cervical, transverse, tubal ligation and spinal anesthesia Postpartum Procedures: none Complications-Operative and Postpartum: none  Temp:  [97.7 F (36.5 C)-98.2 F (36.8 C)] 97.7 F (36.5 C) (03/14 0631) Pulse Rate:  [76-84] 84  (03/14 0631) Resp:  [18] 18  (03/14 0631) BP: (105-116)/(65-75) 110/71 mmHg (03/14 0631) Hemoglobin  Date Value Range Status  05/19/2011 8.3* 12.0-15.0 (g/dL) Final     HCT  Date Value Range Status  05/19/2011 25.4* 36.0-46.0 (%) Final    Hospital Course:  Hospital Course: Admitted for scheduled primary LTCS for primary HSV lesion. Pt was originally scheduled for C/S because of breach presentation, however on admission baby was vertex, but it was noted that pt had an HSV lesion on her labia.  pos GBS. Delivery was performed by Dr. Su Hilt without difficulty. Patient and baby tolerated the procedure without difficulty.  Infant to FTN. Mother and infant then had an uncomplicated postpartum course, with bottle feeding going well. Mom's physical exam was WNL, and she was discharged home in stable condition. Contraception plan was BTL done.  She received adequate benefit from po pain medications. Pt has a hx of PPD, denies SI/HI, denies need for medication. Pt to call office if she needs referral for counseling. Pt is anemic, VSS.   Discharge Diagnoses: Term Pregnancy-delivered, Anemia  Discharge Information: Date: 05/21/2011 Activity: pelvic rest Diet: routine, Iron-rich foods Medications:  Medication List  As of 05/21/2011 12:40 PM   START taking these medications         ibuprofen 600 MG tablet   Commonly known as: ADVIL,MOTRIN   Take 1 tablet (600 mg total) by mouth every 6 (six) hours as needed for pain.      iron polysaccharides 150 MG capsule   Commonly known as: NIFEREX   Take 1  capsule (150 mg total) by mouth daily.      oxyCODONE-acetaminophen 5-325 MG per tablet   Commonly known as: PERCOCET   Take 1-2 tablets by mouth every 4 (four) hours as needed (moderate - severe pain).         CONTINUE taking these medications         prenatal multivitamin Tabs      valACYclovir 500 MG tablet   Commonly known as: VALTREX   Take 1 tablet (500 mg total) by mouth every morning.         STOP taking these medications         acetaminophen 325 MG tablet          Where to get your medications    These are the prescriptions that you need to pick up.   You may get these medications from any pharmacy.         ibuprofen 600 MG tablet   iron polysaccharides 150 MG capsule   oxyCODONE-acetaminophen 5-325 MG per tablet   valACYclovir 500 MG tablet           Condition: stable Instructions: refer to practice specific booklet Discharge to: home Follow-up Information    Follow up with [facility] in 6 weeks. (As needed if symptoms worsen)          Newborn Data: Live born  Information for the patient's newborn:  Cathryn, Gallery [[id number]]  female ; APGAR , 9, 10  ; weight ; 7#9oz Home with mother.  Darcie Mellone M 05/21/2011, 12:40 PM
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
## 2011-05-23 ENCOUNTER — Telehealth: Payer: Self-pay | Admitting: Obstetrics and Gynecology

## 2011-06-02 ENCOUNTER — Encounter: Payer: Medicaid Other | Admitting: Obstetrics and Gynecology

## 2011-06-22 NOTE — Telephone Encounter (Signed)
See note

## 2011-06-30 ENCOUNTER — Encounter: Payer: Self-pay | Admitting: Obstetrics and Gynecology

## 2011-06-30 ENCOUNTER — Ambulatory Visit (INDEPENDENT_AMBULATORY_CARE_PROVIDER_SITE_OTHER): Payer: Medicaid Other | Admitting: Obstetrics and Gynecology

## 2011-06-30 VITALS — BP 112/62 | HR 72 | Temp 96.6°F | Resp 16 | Ht 63.0 in | Wt 175.0 lb

## 2011-06-30 DIAGNOSIS — IMO0002 Reserved for concepts with insufficient information to code with codable children: Secondary | ICD-10-CM

## 2011-06-30 DIAGNOSIS — N898 Other specified noninflammatory disorders of vagina: Secondary | ICD-10-CM

## 2011-06-30 DIAGNOSIS — R87612 Low grade squamous intraepithelial lesion on cytologic smear of cervix (LGSIL): Secondary | ICD-10-CM

## 2011-06-30 DIAGNOSIS — Z98891 History of uterine scar from previous surgery: Secondary | ICD-10-CM

## 2011-06-30 LAB — POCT WET PREP (WET MOUNT): Clue Cells Wet Prep Whiff POC: POSITIVE

## 2011-06-30 MED ORDER — METRONIDAZOLE 500 MG PO TABS: 500.0000 mg | ORAL_TABLET | Freq: Two times a day (BID) | ORAL | Status: AC

## 2011-06-30 NOTE — Progress Notes (Signed)
Date of delivery: [id number] Female Name: Montez Morita Vaginal delivery: no Cesarean section:yes Tubal ligation:yes GDM:no Breast Feeding:no Bottle Feeding:yes Post-Partum Blues:yes Abnormal pap:YES Normal GU function: yes Normal GI function:yes Returning to work:no  Note: Last pap 12/05/2010 LGSIL Mathis Bud  No complaints Filed Vitals:   06/30/11 0918  BP: 112/62  Pulse: 72  Temp: 96.6 F (35.9 C)  Resp: 16   Results for orders placed in visit on 06/30/11  POCT WET PREP (WET MOUNT)      Component Value Range   Source Wet Prep POC vaginal     WBC, Wet Prep HPF POC       Bacteria Wet Prep HPF POC mod     BACTERIA WET PREP MORPHOLOGY POC       Clue Cells Wet Prep HPF POC Moderate     CLUE CELLS WET PREP WHIFF POC Positive Whiff     Yeast Wet Prep HPF POC None     KOH Wet Prep POC       Trichomonas Wet Prep HPF POC none     pH 5.0     A/P Pap today Sched Colpo Flagyl  For BV Will cont valtrex for suppression

## 2011-07-01 LAB — PAP IG W/ RFLX HPV ASCU

## 2011-07-13 ENCOUNTER — Telehealth: Payer: Self-pay

## 2011-07-13 NOTE — Telephone Encounter (Signed)
Pt notified of abnl pap and was sched for colpo 08/05/2011 JO cma

## 2011-08-05 ENCOUNTER — Encounter: Payer: Medicaid Other | Admitting: Obstetrics and Gynecology

## 2011-08-11 ENCOUNTER — Telehealth: Payer: Self-pay

## 2011-08-11 NOTE — Telephone Encounter (Signed)
Called pt to get her R/S for Colpo. We had to R/s her from the 29th of May due to AR being in surgery. Pt states she has no ins. Currently, and she doesn't want to schedule it if she has no ins. Pt agrees to call me as soon as she gets ins.coverage straightened out . I gave her my direct ext. Pt voiced understanding. Melody Comas A

## 2011-12-15 ENCOUNTER — Other Ambulatory Visit: Payer: Self-pay | Admitting: Obstetrics and Gynecology

## 2012-11-21 ENCOUNTER — Emergency Department (HOSPITAL_COMMUNITY)
Admission: EM | Admit: 2012-11-21 | Discharge: 2012-11-21 | Disposition: A | Discharge status: Home / Self Care | Payer: Medicaid Other | Attending: Emergency Medicine | Admitting: Emergency Medicine

## 2012-11-21 ENCOUNTER — Encounter (HOSPITAL_COMMUNITY): Payer: Self-pay

## 2012-11-21 DIAGNOSIS — Y9289 Other specified places as the place of occurrence of the external cause: Secondary | ICD-10-CM | POA: Insufficient documentation

## 2012-11-21 DIAGNOSIS — R011 Cardiac murmur, unspecified: Secondary | ICD-10-CM | POA: Insufficient documentation

## 2012-11-21 DIAGNOSIS — F17200 Nicotine dependence, unspecified, uncomplicated: Secondary | ICD-10-CM | POA: Insufficient documentation

## 2012-11-21 DIAGNOSIS — L089 Local infection of the skin and subcutaneous tissue, unspecified: Secondary | ICD-10-CM | POA: Insufficient documentation

## 2012-11-21 DIAGNOSIS — Z792 Long term (current) use of antibiotics: Secondary | ICD-10-CM | POA: Insufficient documentation

## 2012-11-21 DIAGNOSIS — L03116 Cellulitis of left lower limb: Secondary | ICD-10-CM

## 2012-11-21 DIAGNOSIS — Z8719 Personal history of other diseases of the digestive system: Secondary | ICD-10-CM | POA: Insufficient documentation

## 2012-11-21 DIAGNOSIS — Y9389 Activity, other specified: Secondary | ICD-10-CM | POA: Insufficient documentation

## 2012-11-21 DIAGNOSIS — Z862 Personal history of diseases of the blood and blood-forming organs and certain disorders involving the immune mechanism: Secondary | ICD-10-CM | POA: Insufficient documentation

## 2012-11-21 MED ORDER — DIPHENHYDRAMINE HCL 25 MG PO TABS: 25.0000 mg | ORAL_TABLET | Freq: Four times a day (QID) | ORAL | Status: DC | PRN

## 2012-11-21 MED ORDER — TRAMADOL HCL 50 MG PO TABS: 50.0000 mg | ORAL_TABLET | Freq: Four times a day (QID) | ORAL | Status: DC | PRN

## 2012-11-21 MED ORDER — SULFAMETHOXAZOLE-TRIMETHOPRIM 800-160 MG PO TABS: 1.0000 | ORAL_TABLET | Freq: Two times a day (BID) | ORAL | Status: DC

## 2012-11-21 MED ORDER — KETOROLAC TROMETHAMINE 60 MG/2ML IM SOLN
60.0000 mg | Freq: Once | INTRAMUSCULAR | Status: AC
  Administered 2012-11-21: 60 mg via INTRAMUSCULAR
  Filled 2012-11-21: qty 2

## 2012-11-21 NOTE — ED Provider Notes (Signed)
CSN: [id number]     Arrival date & time 11/21/12  0915 History   First MD Initiated Contact with Patient 11/21/12 1017     Chief Complaint  Patient presents with  . Insect Bite   (Consider location/radiation/quality/duration/timing/severity/associated sxs/prior Treatment) HPI Comments: Patient is a 27 year old female past medical history significant for GERD, anemia presenting to the emergency department for an insect bite to the left calf 2 days ago. Patient is unsure what bit her but she has noticed increased redness, warmth, pain to the left calf. She rates her pain as severe throbbing in nature without radiation. She states palpation and ambulating worsen her pain lysing alleviates her pain. Denies any fevers or chills.   Past Medical History  Diagnosis Date  . ANEMIA, IRON DEFICIENCY, MICROCYTIC   . Contraceptive management   . Weight gain   . Anemia   . Heart murmur     no cardiologist, no testing, just when pregnant  . GERD (gastroesophageal reflux disease)     TUMS   Past Surgical History  Procedure Laterality Date  . No past surgeries    . Cesarean section  05/18/2011    Procedure: CESAREAN SECTION;  Surgeon: Purcell Nails, MD;  Location: [facility];  Service: Gynecology;  Laterality: N/A;  Primary   History reviewed. No pertinent family history. History  Substance Use Topics  . Smoking status: Current Every Day Smoker -- 0.00 packs/day for 3 years    Types: Cigarettes  . Smokeless tobacco: Never Used  . Alcohol Use: 0.5 oz/week    1 drink(s) per week   OB History   Grav Para Term Preterm Abortions TAB SAB Ect Mult Living   5 5 3 2  0 0 0 0 0 5     Review of Systems  Constitutional: Negative for fever and chills.  Musculoskeletal: Negative for gait problem.  Skin: Positive for color change and wound. Negative for pallor and rash.  Neurological: Negative for weakness.    Allergies  Morphine and related  Home Medications   Current Outpatient Rx  Name  Route   Sig  Dispense  Refill  . acetaminophen (TYLENOL) 500 MG tablet   Oral   Take 1,000 mg by mouth every 6 (six) hours as needed for pain.         . diphenhydrAMINE (BENADRYL) 25 MG tablet   Oral   Take 1 tablet (25 mg total) by mouth every 6 (six) hours as needed for itching (Rash).   30 tablet   0   . sulfamethoxazole-trimethoprim (SEPTRA DS) 800-160 MG per tablet   Oral   Take 1 tablet by mouth every 12 (twelve) hours.   20 tablet   0   . traMADol (ULTRAM) 50 MG tablet   Oral   Take 1 tablet (50 mg total) by mouth every 6 (six) hours as needed for pain.   15 tablet   0    BP 124/79  Pulse 97  Resp 16  SpO2 98%  LMP 10/31/2012 Physical Exam  Constitutional: She is oriented to person, place, and time. She appears well-developed and well-nourished. No distress.  HENT:  Head: Normocephalic and atraumatic.  Right Ear: External ear normal.  Left Ear: External ear normal.  Nose: Nose normal.  Eyes: Conjunctivae are normal.  Neck: Neck supple.  Cardiovascular: Normal rate, regular rhythm and normal heart sounds.   Pulmonary/Chest: Effort normal.  Musculoskeletal: Normal range of motion.  Neurological: She is alert and oriented to person, place, and  time. She has normal strength. No sensory deficit.  Skin: Skin is warm and dry. She is not diaphoretic. There is erythema.     Warm erythematous TTP area to L calf w/o sharply demarcated borders. Non circumferential. ROM intact to joint above and below. Borders marked at this ED visit.   Psychiatric: She has a normal mood and affect.    ED Course  Procedures (including critical care time)  Medications  ketorolac (TORADOL) injection 60 mg (60 mg Intramuscular Given 11/21/12 1154)     Labs Review Labs Reviewed - No data to display Imaging Review No results found.  MDM   1. Cellulitis of left lower leg     Afebrile, NAD, non-toxic appearing, AAOx4. Suspect uncomplicated cellulitis based on limited area of  involvement, minimal pain, no systemic signs of illness (eg, fever, chills, dehydration, altered mental status, tachypnea, tachycardia, hypotension), no risk factors for serious illness (eg, extremes of age, general debility, immunocompromised status). PE reveals redness, swelling, mildly tender, warm to touch. Skin intact, No bleeding. No bullae. Non purulent. Non circumferential.  Borders are not elevated or sharply demarcated.  No signs of abscess. No concern for compartment syndrome. Drew a line around the area of infection. Pt was instructed to return to the ED if area surpasses the boarder or pain intensifies. Will prescribed Bactrim to cover for MRSA, direct pt to apply warm compresses and to return to ED for I&D if pain should increase or abscess should develop. Resource guide provided. Patient is agreeable to plan. Patient is stable at time of discharge        Jeannetta Ellis, PA-C 11/21/12 1802

## 2012-11-21 NOTE — ED Provider Notes (Signed)
Medical screening examination/treatment/procedure(s) were performed by non-physician practitioner and as supervising physician I was immediately available for consultation/collaboration.   Taqwa Deem B. Paislynn Hegstrom, MD 11/21/12 2011 

## 2012-11-21 NOTE — ED Notes (Addendum)
Pt c/o insect bite on L calf x 1 week ago.  Pain score 8/10.  Pt sts "started small like a mosquito bite and I kept scratching it.  I scratched it once and clear stuff came out of it.  It keeps getting bigger, so I drained it this morning and yellow, bloody pus came out."  Redness noted.  Vitals are stable.

## 2012-11-21 NOTE — Progress Notes (Signed)
[facility] provided pt with a [facility] Orange Card application and a list of primary care resources.  °

## 2014-01-08 ENCOUNTER — Encounter (HOSPITAL_COMMUNITY): Payer: Self-pay

## 2014-03-22 ENCOUNTER — Encounter (HOSPITAL_COMMUNITY): Payer: Self-pay | Admitting: Obstetrics and Gynecology

## 2014-08-16 ENCOUNTER — Encounter (HOSPITAL_COMMUNITY): Payer: Self-pay | Admitting: Obstetrics and Gynecology

## 2015-07-08 ENCOUNTER — Telehealth: Payer: Self-pay | Admitting: *Deleted

## 2015-07-08 ENCOUNTER — Encounter (HOSPITAL_COMMUNITY): Payer: Self-pay | Admitting: Emergency Medicine

## 2015-07-08 ENCOUNTER — Emergency Department (HOSPITAL_COMMUNITY): Payer: Self-pay

## 2015-07-08 ENCOUNTER — Emergency Department (HOSPITAL_COMMUNITY)
Admission: EM | Admit: 2015-07-08 | Discharge: 2015-07-08 | Disposition: A | Discharge status: Home / Self Care | Payer: Self-pay | Attending: Emergency Medicine | Admitting: Emergency Medicine

## 2015-07-08 DIAGNOSIS — Z79891 Long term (current) use of opiate analgesic: Secondary | ICD-10-CM | POA: Insufficient documentation

## 2015-07-08 DIAGNOSIS — R51 Headache: Secondary | ICD-10-CM | POA: Insufficient documentation

## 2015-07-08 DIAGNOSIS — K219 Gastro-esophageal reflux disease without esophagitis: Secondary | ICD-10-CM | POA: Insufficient documentation

## 2015-07-08 DIAGNOSIS — Z79899 Other long term (current) drug therapy: Secondary | ICD-10-CM | POA: Insufficient documentation

## 2015-07-08 DIAGNOSIS — R569 Unspecified convulsions: Secondary | ICD-10-CM | POA: Insufficient documentation

## 2015-07-08 DIAGNOSIS — F17210 Nicotine dependence, cigarettes, uncomplicated: Secondary | ICD-10-CM | POA: Insufficient documentation

## 2015-07-08 DIAGNOSIS — Z792 Long term (current) use of antibiotics: Secondary | ICD-10-CM | POA: Insufficient documentation

## 2015-07-08 LAB — COMPREHENSIVE METABOLIC PANEL
ALK PHOS: 57 U/L (ref 38–126)
ALT: 20 U/L (ref 14–54)
ANION GAP: 8 (ref 5–15)
AST: 23 U/L (ref 15–41)
Albumin: 4 g/dL (ref 3.5–5.0)
BILIRUBIN TOTAL: 0.3 mg/dL (ref 0.3–1.2)
BUN: 13 mg/dL (ref 6–20)
CALCIUM: 8.9 mg/dL (ref 8.9–10.3)
CO2: 24 mmol/L (ref 22–32)
Chloride: 108 mmol/L (ref 101–111)
Creatinine, Ser: 0.94 mg/dL (ref 0.44–1.00)
Glucose, Bld: 94 mg/dL (ref 65–99)
Potassium: 3.8 mmol/L (ref 3.5–5.1)
SODIUM: 140 mmol/L (ref 135–145)
TOTAL PROTEIN: 7.3 g/dL (ref 6.5–8.1)

## 2015-07-08 LAB — CBC
HEMATOCRIT: 34.9 % — AB (ref 36.0–46.0)
HEMOGLOBIN: 11.4 g/dL — AB (ref 12.0–15.0)
MCH: 26.6 pg (ref 26.0–34.0)
MCHC: 32.7 g/dL (ref 30.0–36.0)
MCV: 81.4 fL (ref 78.0–100.0)
Platelets: 193 10*3/uL (ref 150–400)
RBC: 4.29 MIL/uL (ref 3.87–5.11)
RDW: 14 % (ref 11.5–15.5)
WBC: 8 10*3/uL (ref 4.0–10.5)

## 2015-07-08 LAB — I-STAT BETA HCG BLOOD, ED (MC, WL, AP ONLY): I-stat hCG, quantitative: <5 m[IU]/mL (ref <5)

## 2015-07-08 LAB — URINALYSIS, ROUTINE W REFLEX MICROSCOPIC
Bilirubin Urine: NEGATIVE
Glucose, UA: NEGATIVE mg/dL
Hgb urine dipstick: NEGATIVE
KETONES UR: NEGATIVE mg/dL
NITRITE: NEGATIVE
Protein, ur: NEGATIVE mg/dL
Specific Gravity, Urine: 1.018 (ref 1.005–1.030)
pH: 5.5 (ref 5.0–8.0)

## 2015-07-08 LAB — URINE MICROSCOPIC-ADD ON

## 2015-07-08 MED ORDER — HYDROCODONE-ACETAMINOPHEN 5-325 MG PO TABS
2.0000 | ORAL_TABLET | Freq: Once | ORAL | Status: AC
  Administered 2015-07-08: 2 via ORAL
  Filled 2015-07-08: qty 2

## 2015-07-08 MED ORDER — LEVETIRACETAM 500 MG PO TABS: 500.0000 mg | ORAL_TABLET | Freq: Two times a day (BID) | ORAL | Status: DC

## 2015-07-08 MED ORDER — GADOBENATE DIMEGLUMINE 529 MG/ML IV SOLN
20.0000 mL | Freq: Once | INTRAVENOUS | Status: AC | PRN
  Administered 2015-07-08: 16 mL via INTRAVENOUS

## 2015-07-08 MED ORDER — LEVETIRACETAM 500 MG PO TABS
1000.0000 mg | ORAL_TABLET | Freq: Once | ORAL | Status: AC
  Administered 2015-07-08: 1000 mg via ORAL
  Filled 2015-07-08: qty 2

## 2015-07-08 NOTE — ED Notes (Signed)
Patient transported to MRI 

## 2015-07-08 NOTE — ED Notes (Signed)
Bed: WA17 Expected date: 07/08/15 Expected time: 7:09 AM Means of arrival:  Comments: EMS seizure

## 2015-07-08 NOTE — ED Provider Notes (Signed)
CSN: [id number]     Arrival date & time 07/08/15  0720 History   First MD Initiated Contact with Patient 07/08/15 [phone]     Chief Complaint  Patient presents with  . Seizures     (Consider location/radiation/quality/duration/timing/severity/associated sxs/prior Treatment) Patient is a 30 y.o. female presenting with seizures. The history is provided by the patient.  Seizures Patient was walked out of home, was on front lawn, patient looked up (family thought she was looking at something flying around - family states had similar thing prior to previous seizure), then fell to ground and had full boding shaking, 'foaming at mouth', lasted couple minutes, then looked scared/confused for a few minutes afterwards. No incontinence. Felt normal earlier this AM. No recent head injury. Post fall/seizure c/o mid back pain. No radicular pain. No numbness/weakness. No headache. No chest pain or sob. No nvd. No fever or chills. States 4 prior seizures, generally associated w prior pregnancies - is on period now/normal, hx tubal ligation.  States neuro workup then neg, and has never taken seizure meds. No recent change in meds or new meds. No drug use. No recent sleep deprivation.      Past Medical History  Diagnosis Date  . ANEMIA, IRON DEFICIENCY, MICROCYTIC   . Contraceptive management   . Weight gain   . Anemia   . Heart murmur     no cardiologist, no testing, just when pregnant  . GERD (gastroesophageal reflux disease)     TUMS   Past Surgical History  Procedure Laterality Date  . No past surgeries    . Cesarean section  05/18/2011    Procedure: CESAREAN SECTION;  Surgeon: Delice Lesch, MD;  Location: [facility];  Service: Gynecology;  Laterality: N/A;  Primary   No family history on file. Social History  Substance Use Topics  . Smoking status: Current Every Day Smoker -- 0.00 packs/day for 3 years    Types: Cigarettes  . Smokeless tobacco: Never Used  . Alcohol Use: 0.5 oz/week    1  drink(s) per week   OB History    Gravida Para Term Preterm AB TAB SAB Ectopic Multiple Living   5 5 3 2  0 0 0 0 0 5     Review of Systems  Constitutional: Negative for fever and chills.  HENT: Negative for sore throat.   Eyes: Negative for redness.  Respiratory: Negative for cough and shortness of breath.   Cardiovascular: Negative for chest pain.  Gastrointestinal: Negative for vomiting, abdominal pain and diarrhea.  Genitourinary: Negative for dysuria and flank pain.       On period now, normal time, normal amt bleeding.   Musculoskeletal: Negative for back pain and neck pain.  Skin: Negative for rash.  Neurological: Positive for seizures. Negative for weakness, numbness and headaches.  Hematological: Does not bruise/bleed easily.  Psychiatric/Behavioral: Negative for confusion.      Allergies  Morphine and related  Home Medications   Prior to Admission medications   Medication Sig Start Date End Date Taking? Authorizing Provider  acetaminophen (TYLENOL) 500 MG tablet Take 1,000 mg by mouth every 6 (six) hours as needed for pain.    Historical Provider, MD  diphenhydrAMINE (BENADRYL) 25 MG tablet Take 1 tablet (25 mg total) by mouth every 6 (six) hours as needed for itching (Rash). 11/21/12   Jennifer Piepenbrink, PA-C  sulfamethoxazole-trimethoprim (SEPTRA DS) 800-160 MG per tablet Take 1 tablet by mouth every 12 (twelve) hours. 11/21/12   Baron Sane, PA-C  traMADol (ULTRAM) 50 MG tablet Take 1 tablet (50 mg total) by mouth every 6 (six) hours as needed for pain. 11/21/12   Jennifer Piepenbrink, PA-C   BP 106/63 mmHg  Pulse 110  Temp(Src) 99 F (37.2 C) (Oral)  Resp 18  SpO2 97% Physical Exam  Constitutional: She is oriented to person, place, and time. She appears well-developed and well-nourished. No distress.  HENT:  Mouth/Throat: Oropharynx is clear and moist.  Contusion lateral edge tongue. No lac.   Eyes: Conjunctivae and EOM are normal. Pupils are  equal, round, and reactive to light. No scleral icterus.  Neck: Neck supple. No tracheal deviation present.  No bruit  Cardiovascular: Normal rate, regular rhythm, normal heart sounds and intact distal pulses.   No murmur heard. Pulmonary/Chest: Effort normal and breath sounds normal. No respiratory distress.  Abdominal: Soft. Normal appearance and bowel sounds are normal. She exhibits no distension. There is no tenderness.  Genitourinary:  No cva tenderness  Musculoskeletal: She exhibits no edema.  Mid thoracic tenderness, otherwise, CTLS spine, non tender, aligned, no step off. Good rom bil ext, no focal bony tenderness.   Neurological: She is alert and oriented to person, place, and time. No cranial nerve deficit.  Motor intact bil. stre 5/5. sens grossly intact.   Skin: Skin is warm and dry. No rash noted. She is not diaphoretic.  Psychiatric: She has a normal mood and affect.  Nursing note and vitals reviewed.   ED Course  Procedures (including critical care time) Labs Review   Results for orders placed or performed during the hospital encounter of 07/08/15  CBC  Result Value Ref Range   WBC 8.0 4.0 - 10.5 K/uL   RBC 4.29 3.87 - 5.11 MIL/uL   Hemoglobin 11.4 (L) 12.0 - 15.0 g/dL   HCT 34.9 (L) 36.0 - 46.0 %   MCV 81.4 78.0 - 100.0 fL   MCH 26.6 26.0 - 34.0 pg   MCHC 32.7 30.0 - 36.0 g/dL   RDW 14.0 11.5 - 15.5 %   Platelets 193 150 - 400 K/uL  Comprehensive metabolic panel  Result Value Ref Range   Sodium 140 135 - 145 mmol/L   Potassium 3.8 3.5 - 5.1 mmol/L   Chloride 108 101 - 111 mmol/L   CO2 24 22 - 32 mmol/L   Glucose, Bld 94 65 - 99 mg/dL   BUN 13 6 - 20 mg/dL   Creatinine, Ser 0.94 0.44 - 1.00 mg/dL   Calcium 8.9 8.9 - 10.3 mg/dL   Total Protein 7.3 6.5 - 8.1 g/dL   Albumin 4.0 3.5 - 5.0 g/dL   AST 23 15 - 41 U/L   ALT 20 14 - 54 U/L   Alkaline Phosphatase 57 38 - 126 U/L   Total Bilirubin 0.3 0.3 - 1.2 mg/dL   GFR calc non Af Amer >60 >60 mL/min   GFR  calc Af Amer >60 >60 mL/min   Anion gap 8 5 - 15  Urinalysis, Routine w reflex microscopic (not at [facility])  Result Value Ref Range   Color, Urine YELLOW YELLOW   APPearance CLOUDY (A) CLEAR   Specific Gravity, Urine 1.018 1.005 - 1.030   pH 5.5 5.0 - 8.0   Glucose, UA NEGATIVE NEGATIVE mg/dL   Hgb urine dipstick NEGATIVE NEGATIVE   Bilirubin Urine NEGATIVE NEGATIVE   Ketones, ur NEGATIVE NEGATIVE mg/dL   Protein, ur NEGATIVE NEGATIVE mg/dL   Nitrite NEGATIVE NEGATIVE   Leukocytes, UA SMALL (A) NEGATIVE  Urine  microscopic-add on  Result Value Ref Range   Squamous Epithelial / LPF 0-5 (A) NONE SEEN   WBC, UA 0-5 0 - 5 WBC/hpf   RBC / HPF 0-5 0 - 5 RBC/hpf   Bacteria, UA RARE (A) NONE SEEN   Urine-Other TRICHOMONAS PRESENT   I-Stat Beta hCG blood, ED ([facility], [facility], [facility] only)  Result Value Ref Range   I-stat hCG, quantitative <5.0 <5 mIU/mL   Comment 3           Dg Thoracic Spine 2 View  07/08/2015  CLINICAL DATA:  Seizure this morning, mid upper back pain EXAM: THORACIC SPINE 2 VIEWS COMPARISON:  None FINDINGS: 12 pairs of ribs. Osseous mineralization normal. Vertebral body heights maintained without fracture or subluxation. No bone destruction. Visualized posterior ribs unremarkable. IMPRESSION: No acute osseous abnormalities. Electronically Signed   By: Lavonia Dana M.D.   On: 07/08/2015 08:46   Ct Head Wo Contrast  07/08/2015  CLINICAL DATA:  Seizure this morning. Headaches. History of previous seizures. EXAM: CT HEAD WITHOUT CONTRAST TECHNIQUE: Contiguous axial images were obtained from the base of the skull through the vertex without intravenous contrast. COMPARISON:  05/31/2005 and 05/22/2005 FINDINGS: There is a vague 11 mm area of decreased density in the right insula just deep to the cortex. There is no midline shift. The brain parenchyma is otherwise normal. Bones are normal except for opacification of the visualized portion of the left maxillary sinus. No acute intracranial hemorrhage or  acute infarction. IMPRESSION: Nonspecific 11 mm low-density area in the right insula. I recommend MRI of the brain with and without contrast further characterization. Electronically Signed   By: Lorriane Shire M.D.   On: 07/08/2015 09:28   Mr Jeri Cos [id number] Contrast  07/08/2015  CLINICAL DATA:  Witnessed seizure earlier today.  Headache. EXAM: MRI HEAD WITHOUT AND WITH CONTRAST TECHNIQUE: Multiplanar, multiecho pulse sequences of the brain and surrounding structures were obtained without and with intravenous contrast. CONTRAST:  40mL MULTIHANCE GADOBENATE DIMEGLUMINE 529 MG/ML IV SOLN COMPARISON:  CT head earlier today, also multiple prior CT head scans dating as far back as 05/22/2005. FINDINGS: The patient was unable to remain motionless for the exam. Small or subtle lesions could be overlooked. No evidence for acute infarction, hemorrhage, mass lesion, hydrocephalus, or extra-axial fluid. Normal cerebral volume. No white matter disease. Thin-section high-resolution T2 weighted images in the coronal plane demonstrate no temporal lobe or insular asymmetry. Flow voids are maintained throughout the carotid, basilar, and vertebral arteries. There are no areas of chronic hemorrhage. Pituitary, pineal, and cerebellar tonsils unremarkable. No upper cervical lesions. Post infusion, no abnormal enhancement of the brain or meninges. Visualized calvarium, skull base, and upper cervical osseous structures unremarkable. Scalp and extracranial soft tissues, orbits, sinuses, and mastoids show no acute process. Compared with the recent CT, the area of concern represents partial volume averaging of the white matter. IMPRESSION: Negative exam. No acute or focal intracranial abnormality. No seizure focus is evident. Electronically Signed   By: Staci Righter M.D.   On: 07/08/2015 12:02      I have personally reviewed and evaluated these images and lab results as part of my medical decision-making.   MDM   Iv ns. Continuous  pulse ox and monitor. Seizure precautions.  Reviewed nursing notes and prior charts for additional history.   Labs. Ct.  Neurology consulted - discussed with Dr Cristobal Goldmann, included hx 4th or 5th seizure, no meds for same, ct reading etc.  He agrees w  mr.  States start on keppra 500 mg bid, may given 1000 mg in ED.  He indicates will need outpatient follow up and eeg.  keppra 1000 mg.  Discussed plan, wait for mr.  Mri done.  Discussed results with patient.  No seizure activity in ED.  Patient indicates feels improved.   Patient currently appears stable for d/c.   Return precautions provided.      Lajean Saver, MD 07/08/15 [phone]

## 2015-07-08 NOTE — ED Notes (Signed)
Per EMS pt had a witnessed seizure this am outside in yard. Pt c/o of pain in the middle of her back. Pt states a history of approximately 4 seizures.

## 2015-07-08 NOTE — ED Notes (Signed)
Pt not in room; with pt return from MRI will administer pain medications as ordered.

## 2015-07-08 NOTE — ED Notes (Signed)
Will pick up for MRI in 30 minutes or less

## 2015-07-08 NOTE — ED Notes (Signed)
Pt is requesting something for pain-states he back is hurting from her fall.

## 2015-07-08 NOTE — Discharge Instructions (Signed)
It was our pleasure to provide your ER care today - we hope that you feel better.  Take keppra (seizure medication) as prescribed.  For back pain, you may take motrin or aleve as need.   No driving, operating heavy machinery, or swimming, until clear to do so by your doctor/neurologist.   Follow up with neurology for further evaluation and EEG in the next 1-2 weeks - see referral - call office to arrange appointment.   Return to ER if worse, new symptoms, recurrent seizures, worsening or severe pain, other concern.      Seizure, Adult A seizure is abnormal electrical activity in the brain. Seizures usually last from 30 seconds to 2 minutes. There are various types of seizures. Before a seizure, you may have a warning sensation (aura) that a seizure is about to occur. An aura may include the following symptoms:   Fear or anxiety.  Nausea.  Feeling like the room is spinning (vertigo).  Vision changes, such as seeing flashing lights or spots. Common symptoms during a seizure include:  A change in attention or behavior (altered mental status).  Convulsions with rhythmic jerking movements.  Drooling.  Rapid eye movements.  Grunting.  Loss of bladder and bowel control.  Bitter taste in the mouth.  Tongue biting. After a seizure, you may feel confused and sleepy. You may also have an injury resulting from convulsions during the seizure. HOME CARE INSTRUCTIONS   If you are given medicines, take them exactly as prescribed by your health care provider.  Keep all follow-up appointments as directed by your health care provider.  Do not swim or drive or engage in risky activity during which a seizure could cause further injury to you or others until your health care provider says it is OK.  Get adequate rest.  Teach friends and family what to do if you have a seizure. They should:  Lay you on the ground to prevent a fall.  Put a cushion under your head.  Loosen any  tight clothing around your neck.  Turn you on your side. If vomiting occurs, this helps keep your airway clear.  Stay with you until you recover.  Know whether or not you need emergency care. SEEK IMMEDIATE MEDICAL CARE IF:  The seizure lasts longer than 5 minutes.  The seizure is severe or you do not wake up immediately after the seizure.  You have an altered mental status after the seizure.  You are having more frequent or worsening seizures. Someone should drive you to the emergency department or call local emergency services (911 in [place] MAKE SURE YOU:  Understand these instructions.  Will watch your condition.  Will get help right away if you are not doing well or get worse.   This information is not intended to replace advice given to you by your health care provider. Make sure you discuss any questions you have with your health care provider.   Document Released: 02/21/2000 Document Revised: 03/16/2014 Document Reviewed: 10/05/2012 Elsevier Interactive Patient Education [employer].

## 2015-07-08 NOTE — ED Notes (Signed)
Patient transported to X-ray 

## 2017-08-22 IMAGING — CT CT HEAD W/O CM
2 series · 17 of 30 positions shown, 20 images · non-contrast
Comparison: 05/31/2005 and 05/22/2005

CLINICAL DATA: Seizure this morning. Headaches. History of previous
seizures.

EXAM:
CT HEAD WITHOUT CONTRAST
TECHNIQUE: Contiguous axial images were obtained from the base of the skull
through the vertex without intravenous contrast.

[Series 2: head w/o · axial · non-contrast · 0.45mm/px · z∈[-97,+23]mm · 9 of 30 slices shown, 12 images]
[im 3/30  brain]
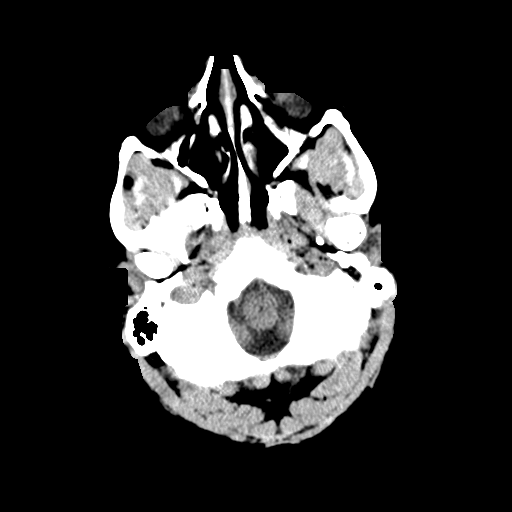
[im 3/30  bone]
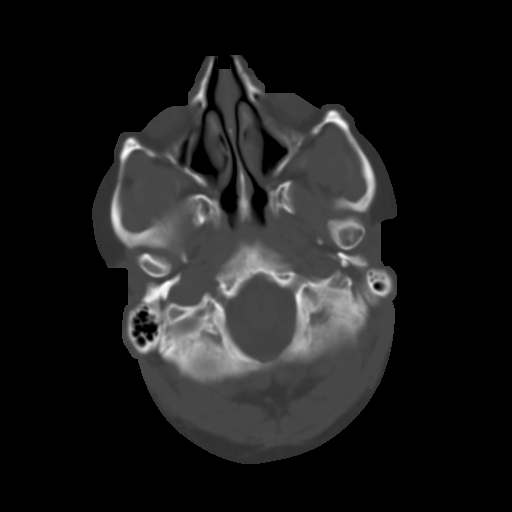
[im 6/30  brain]
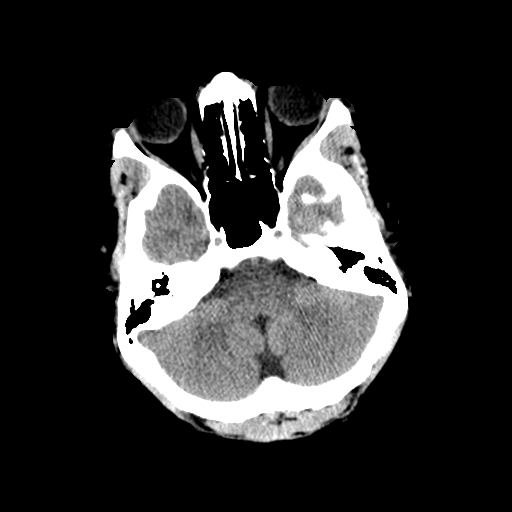
[im 9/30  brain]
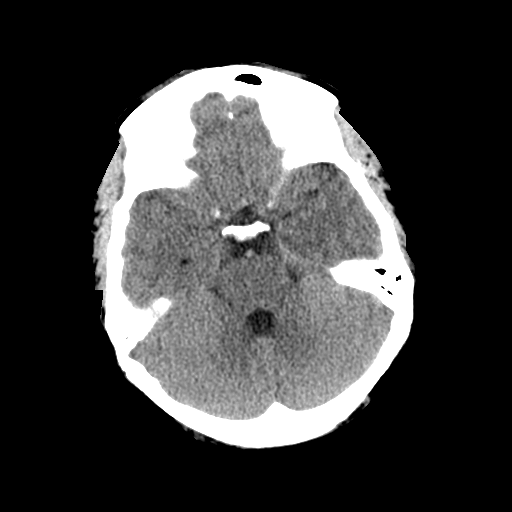
[im 12/30  brain]
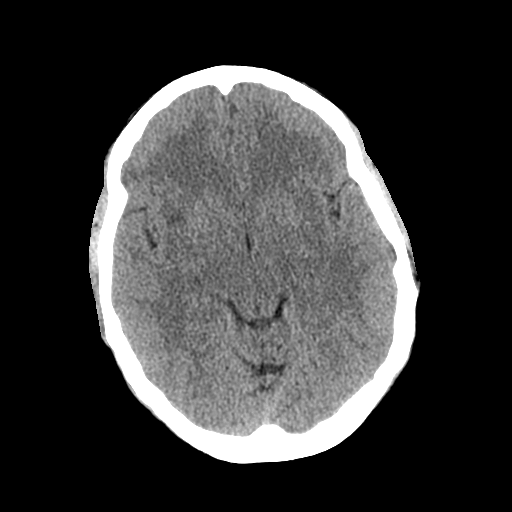
[im 15/30  brain]
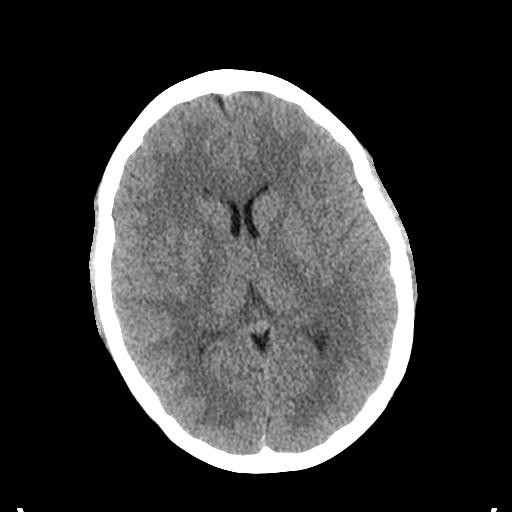
[im 15/30  bone]
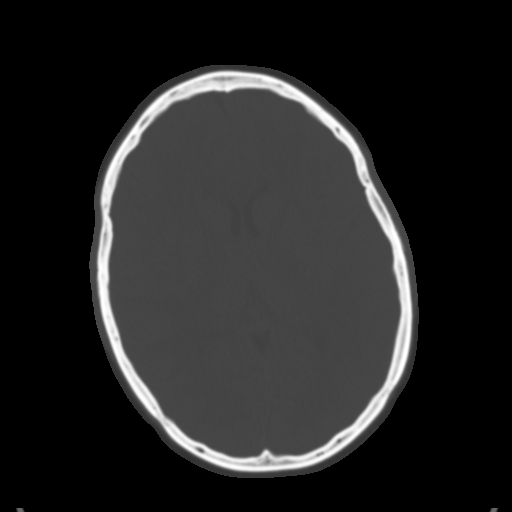
[im 18/30  brain]
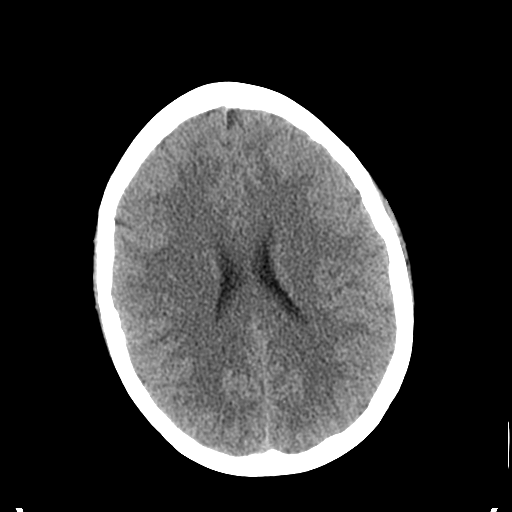
[im 21/30  brain]
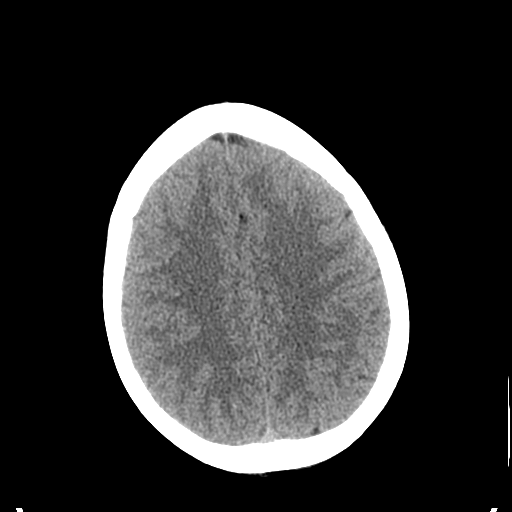
[im 24/30  brain]
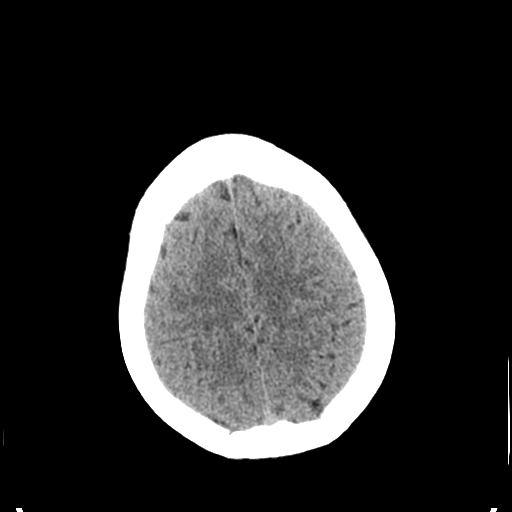
[im 27/30  brain]
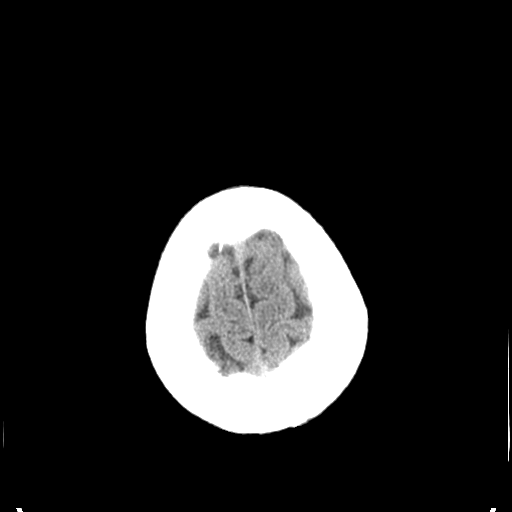
[im 27/30  bone]
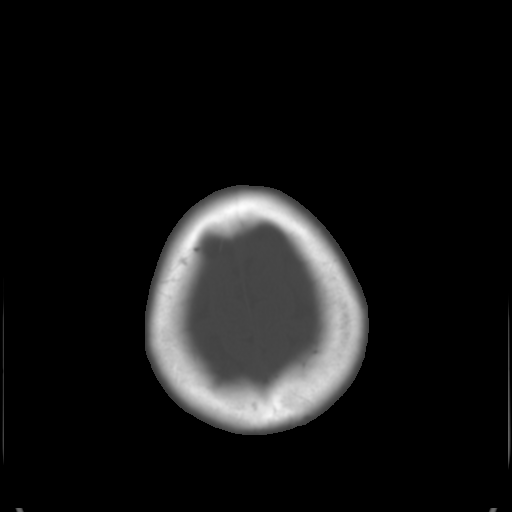

[Series 3: bone windows · axial · 0.45mm/px · z∈[-92,+22]mm · 8 of 50 slices shown]
[im 6/50  bone]
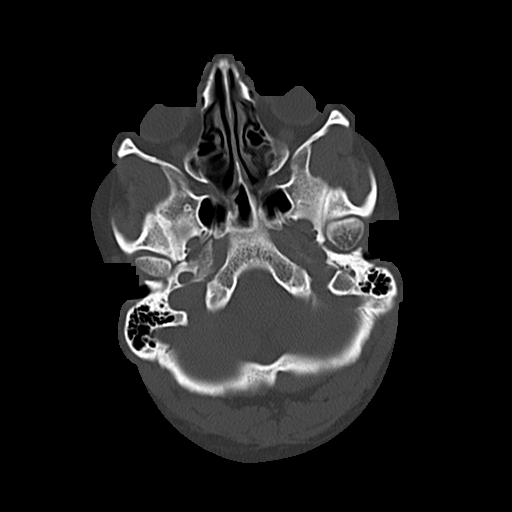
[im 11/50  bone]
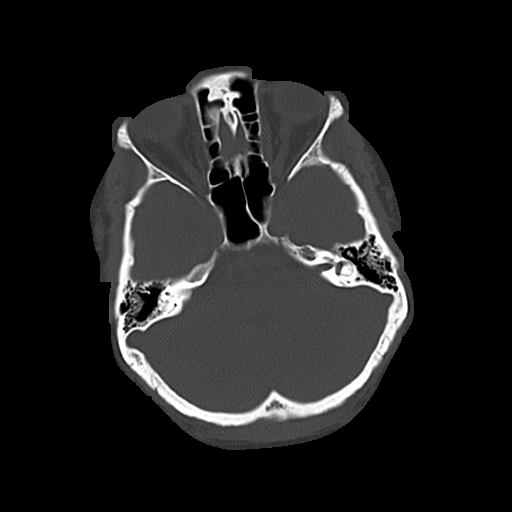
[im 17/50  bone]
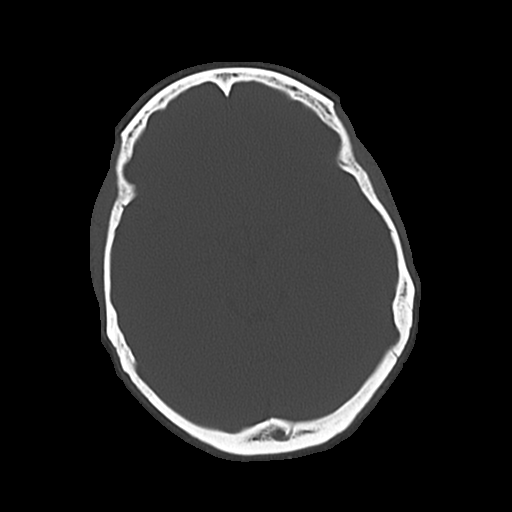
[im 22/50  bone]
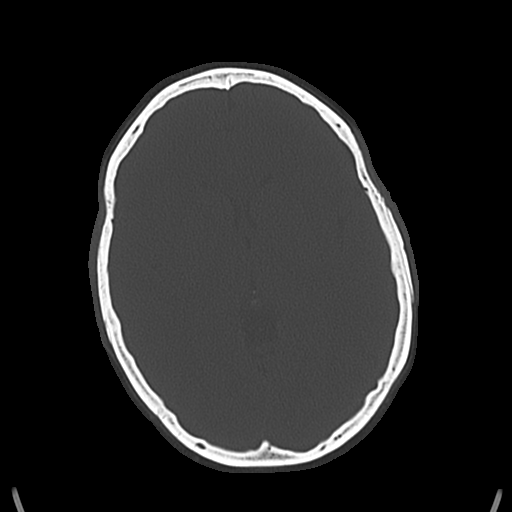
[im 28/50  bone]
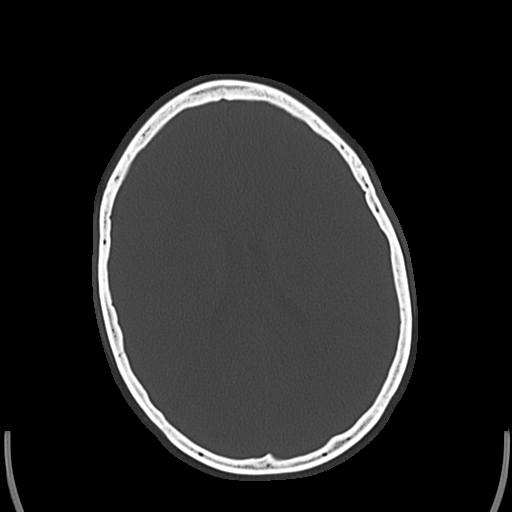
[im 33/50  bone]
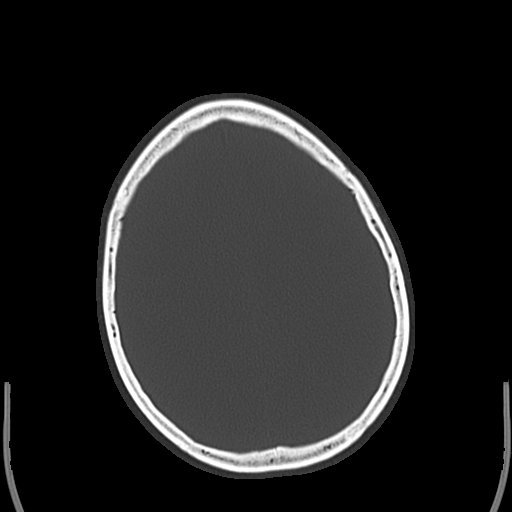
[im 39/50  bone]
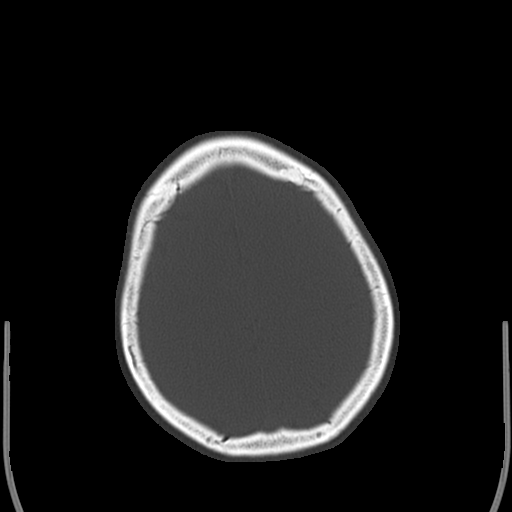
[im 44/50  bone]
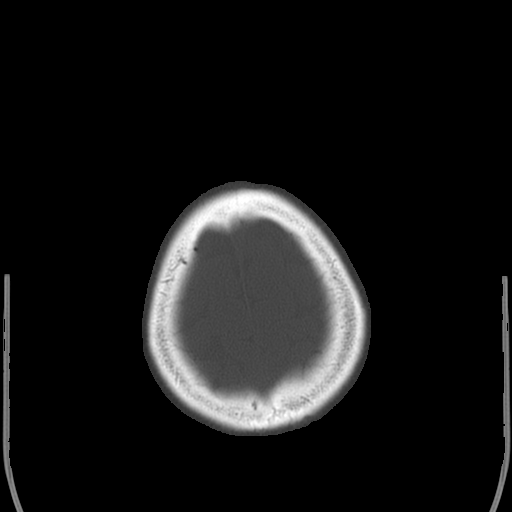

[17 of 30 positions shown; findings below may reference images not displayed]

FINDINGS: There is a vague 11 mm area of decreased density in the right insula
just deep to the cortex. There is no midline shift. The brain
parenchyma is otherwise normal. Bones are normal except for
opacification of the visualized portion of the left maxillary sinus.

No acute intracranial hemorrhage or acute infarction.
IMPRESSION: Nonspecific 11 mm low-density area in the right insula. I recommend
MRI of the brain with and without contrast further characterization.

## 2017-08-22 IMAGING — CR DG THORACIC SPINE 2V
2 series · 2 of 2 positions shown · non-contrast
Comparison: None

CLINICAL DATA: Seizure this morning, mid upper back pain

EXAM:
THORACIC SPINE 2 VIEWS

[t thoracic spine ap]
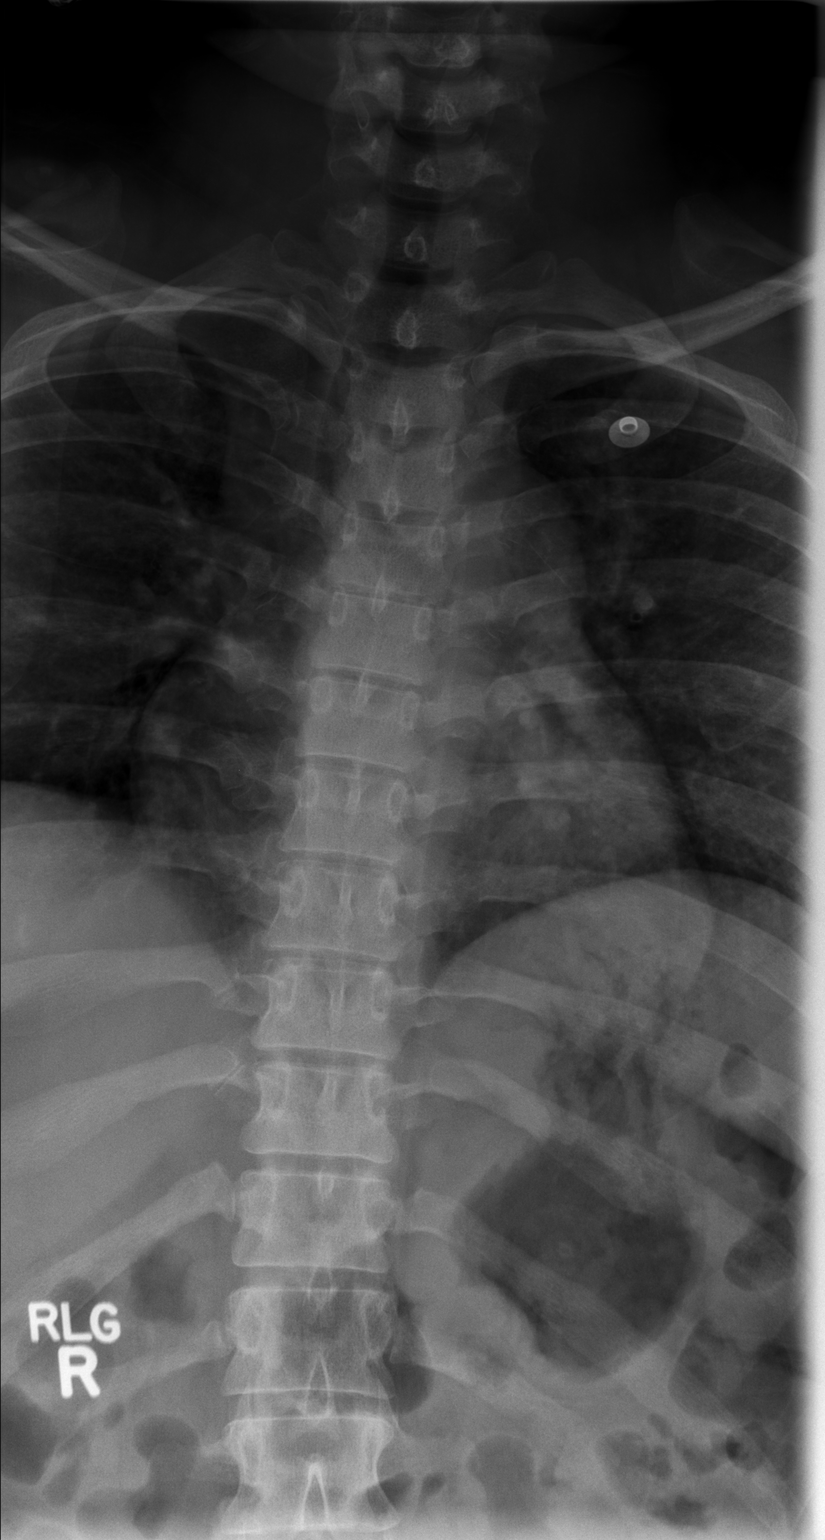

[t thoracic spine lat]
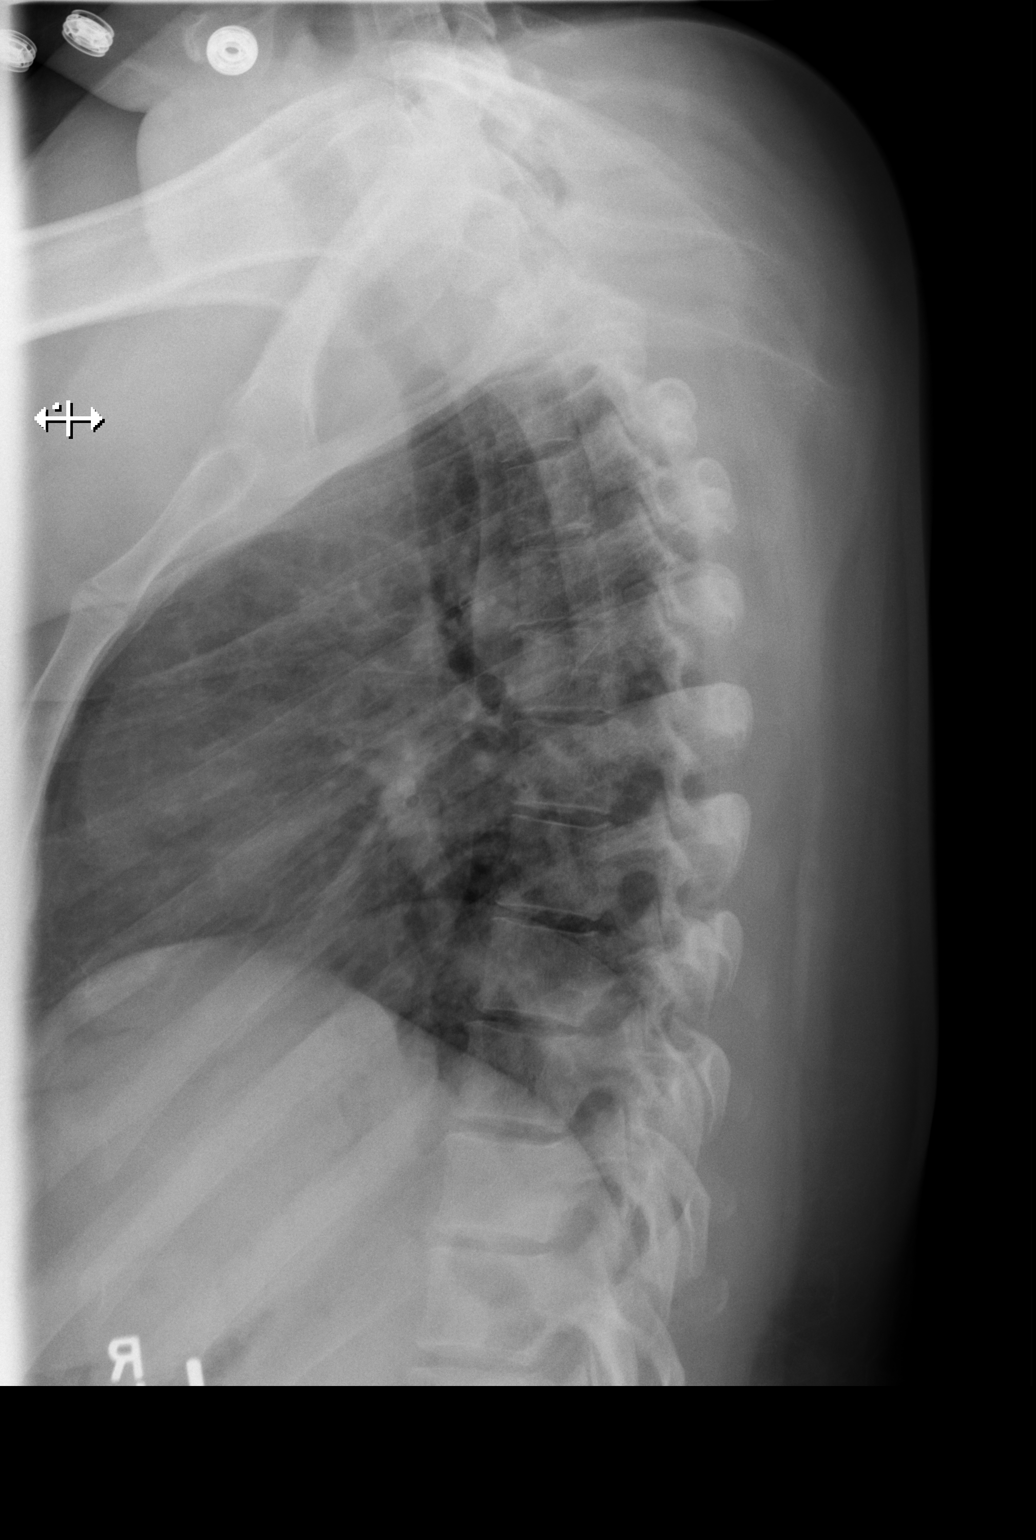

[2 of 2 positions shown; findings below may reference images not displayed]

FINDINGS: 12 pairs of ribs.

Osseous mineralization normal.

Vertebral body heights maintained without fracture or subluxation.

No bone destruction.

Visualized posterior ribs unremarkable.
IMPRESSION: No acute osseous abnormalities.

## 2017-12-24 ENCOUNTER — Other Ambulatory Visit: Payer: Self-pay

## 2017-12-24 ENCOUNTER — Inpatient Hospital Stay (HOSPITAL_COMMUNITY)
Admission: AD | Admit: 2017-12-24 | Discharge: 2017-12-24 | Disposition: A | Discharge status: Home / Self Care | Payer: BLUE CROSS/BLUE SHIELD | Source: Ambulatory Visit | Attending: Obstetrics and Gynecology | Admitting: Obstetrics and Gynecology

## 2017-12-24 ENCOUNTER — Encounter (HOSPITAL_COMMUNITY): Payer: Self-pay | Admitting: *Deleted

## 2017-12-24 DIAGNOSIS — B9689 Other specified bacterial agents as the cause of diseases classified elsewhere: Secondary | ICD-10-CM | POA: Diagnosis not present

## 2017-12-24 DIAGNOSIS — R109 Unspecified abdominal pain: Secondary | ICD-10-CM | POA: Diagnosis not present

## 2017-12-24 DIAGNOSIS — N760 Acute vaginitis: Secondary | ICD-10-CM | POA: Diagnosis not present

## 2017-12-24 HISTORY — DX: Depression, unspecified: F32.A

## 2017-12-24 HISTORY — DX: Unspecified infectious disease: B99.9

## 2017-12-24 HISTORY — DX: Headache: R51

## 2017-12-24 HISTORY — DX: Major depressive disorder, single episode, unspecified: F32.9

## 2017-12-24 HISTORY — DX: Headache, unspecified: R51.9

## 2017-12-24 HISTORY — DX: Unspecified convulsions: R56.9

## 2017-12-24 LAB — URINALYSIS, ROUTINE W REFLEX MICROSCOPIC
BILIRUBIN URINE: NEGATIVE
Glucose, UA: NEGATIVE mg/dL
HGB URINE DIPSTICK: NEGATIVE
Ketones, ur: NEGATIVE mg/dL
Leukocytes, UA: NEGATIVE
NITRITE: NEGATIVE
PH: 5 (ref 5.0–8.0)
Protein, ur: NEGATIVE mg/dL
SPECIFIC GRAVITY, URINE: 1.019 (ref 1.005–1.030)

## 2017-12-24 LAB — WET PREP, GENITAL
SPERM: NONE SEEN
Trich, Wet Prep: NONE SEEN
YEAST WET PREP: NONE SEEN

## 2017-12-24 LAB — POCT PREGNANCY, URINE: PREG TEST UR: NEGATIVE

## 2017-12-24 MED ORDER — ACIDOPHILUS PROBIOTIC 10 MG PO CAPS: 1.0000 | ORAL_CAPSULE | Freq: Every day | ORAL | 0 refills | Status: DC | PRN

## 2017-12-24 MED ORDER — METRONIDAZOLE 500 MG PO TABS: 500.0000 mg | ORAL_TABLET | Freq: Two times a day (BID) | ORAL | 0 refills | Status: DC

## 2017-12-24 NOTE — Discharge Instructions (Signed)

## 2017-12-24 NOTE — MAU Provider Note (Signed)
History     CSN: [id number]  Arrival date and time: 12/24/17 0092   First Provider Initiated Contact with Patient 12/24/17 1018      Chief Complaint  Patient presents with  . Abdominal Pain   HPI  Carrie BRALLEY is a 32 y.o. [id number] non-pregnant patient who presents to MAU with multiple complaints. Patient reports she lost her health insurance and lapsed on health maintenance. She now has [employer] and "would like a checkup".   Right sided mid-abdominal pain This is a recurring problem. Onset four years ago. Patient describes pain as 5/10, lasting 5-7 seconds, does not radiate, aggravated by squatting. Has not taken medication for this pain. 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 Denies urinary symptoms, flank pain, history of gallstones or kidney stones. Denies abnormal vaginal bleeding, pain during sexual intercourse. Denies history of fibroids or cysts.  Abnormal vaginal discharge This is a recurring problem. Onset within the past month. Patient endorses foul-smelling vaginal discharge. Denies discolored discharge. States she has little to no interest in sex and has concerns about her partner of five years due to history of infidelity. S/p BTL, denies condoms for STI protection  Patient's history is significant for seizures. She states she has approximately 1-2 seizures each year and is not compliant with prescribed medications because they make her so sleepy.   Patient Active Problem List   Diagnosis Date Noted  . 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 Status post primary low transverse cesarean section 05/20/2011  . ANEMIA, IRON DEFICIENCY, MICROCYTIC 07/01/2007     Pertinent Gynecological History: Menses: flow is moderate Bleeding: No concerns or issues Contraception: tubal ligation six years ago DES exposure: denies Blood transfusions: none Sexually transmitted  diseases: currently at risk Previous GYN Procedures: N/A  Last mammogram: N/A age 77  Last pap: abnormal: LSIL Date: 06/30/2011 Patient did not have Colposcopy due to being pregnant. Lost insurance prior to being scheduled for colpo   Past Medical History:  Diagnosis Date  . Anemia   . ANEMIA, IRON DEFICIENCY, MICROCYTIC   . Contraceptive management   . Depression    hx, doing ok now  . GERD (gastroesophageal reflux disease)    TUMS  . Headache   . Heart murmur    no cardiologist, no testing, just when pregnant  . Infection    UTI  . Seizures ([place])    last 07/08/2015; unknown cause; not on meds  . Weight gain     Past Surgical History:  Procedure Laterality Date  . CESAREAN SECTION  05/18/2011   Procedure: CESAREAN SECTION;  Surgeon: Delice Lesch, MD;  Location: [facility];  Service: Gynecology;  Laterality: N/A;  Primary    Family History  Problem Relation Age of Onset  . Arthritis Mother   . Heart disease Maternal Grandmother        died from a heart attack    Social History   Tobacco Use  . Smoking status: Current Some Day Smoker    Packs/day: 0.00    Years: 7.00    Pack years: 0.00    Types: Cigarettes  . Smokeless tobacco: Never Used  . Tobacco comment: pack/wk  Substance Use Topics  . Alcohol use: Not Currently    Alcohol/week: 1.0 standard drinks    Types: 1 Standard drinks or equivalent per week  . Drug use: No    Allergies:  Allergies  Allergen Reactions  . Morphine And Related Itching    Medications Prior to Admission  Medication Sig Dispense Refill Last Dose  . acetaminophen (TYLENOL) 500 MG tablet Take 1,000 mg by mouth every 6 (six) hours as needed for pain.   Past Week at Unknown time  . BIOTIN PO Take 1 tablet by mouth daily.   Past Week at Unknown time  . ibuprofen (ADVIL,MOTRIN) 200 MG tablet Take 400 mg by mouth daily as needed for headache or moderate pain.   Past Week at Unknown time  . levETIRAcetam (KEPPRA) 500 MG tablet Take 1  tablet (500 mg total) by mouth 2 (two) times daily. 60 tablet 0   . loratadine (CLARITIN) 10 MG tablet Take 10 mg by mouth daily as needed for allergies.   Past Month at Unknown time    Review of Systems  Constitutional: Negative for chills and fever.  Respiratory: Negative for chest tightness and shortness of breath.   Gastrointestinal: Positive for abdominal pain. Negative for nausea and vomiting.  Endocrine: Negative for polydipsia, polyphagia and polyuria.  Genitourinary: Positive for vaginal discharge. Negative for difficulty urinating, dysuria, vaginal bleeding and vaginal pain.  Musculoskeletal: Negative for back pain.  Neurological: Negative for headaches.  All other systems reviewed and are negative.  Physical Exam   Blood pressure 129/69, temperature 98.5 F (36.9 C), temperature source Oral, resp. rate 17, weight 92.8 kg, last menstrual period 12/16/2017, SpO2 100 %.  Physical Exam  Nursing note and vitals reviewed. Constitutional: She is oriented to person, place, and time. She appears well-developed and well-nourished.  Cardiovascular: Normal rate and intact distal pulses.  Respiratory: Effort normal and breath sounds normal. No respiratory distress.  GI: Soft. Bowel sounds are normal. She exhibits no distension. There is no tenderness. There is no rebound and no guarding.  Genitourinary: Vaginal discharge found.  Genitourinary Comments: Foul smelling discharge noted on physical exam  Musculoskeletal: Normal range of motion.  Neurological: She is alert and oriented to person, place, and time. She has normal reflexes.  Skin: Skin is warm and dry.  Psychiatric: She has a normal mood and affect. Her behavior is normal. Judgment and thought content normal.    MAU Course  Procedures  MDM  --Discussed with patient that she is overdue for follow-up on her LSIL results. Needs to establish care ASAP for probable colpo --No acute issues today. Patient eligible for outpatient  management of concerns.    Patient Vitals for the past 24 hrs:  BP Temp Temp src Pulse Resp SpO2 Weight  12/24/17 1207 (!) 107/59 - - 79 16 - -  12/24/17 0952 129/69 98.5 F (36.9 C) Oral - 17 100 % 92.8 kg    Results for orders placed or performed during the hospital encounter of 12/24/17 (from the past 24 hour(s))  Urinalysis, Routine w reflex microscopic     Status: None   Collection Time: 12/24/17 10:01 AM  Result Value Ref Range   Color, Urine YELLOW YELLOW   APPearance CLEAR CLEAR   Specific Gravity, Urine 1.019 1.005 - 1.030   pH 5.0 5.0 - 8.0   Glucose, UA NEGATIVE NEGATIVE mg/dL   Hgb urine dipstick NEGATIVE NEGATIVE   Bilirubin Urine NEGATIVE NEGATIVE   Ketones, ur NEGATIVE NEGATIVE mg/dL   Protein, ur NEGATIVE NEGATIVE mg/dL   Nitrite NEGATIVE NEGATIVE   Leukocytes, UA NEGATIVE NEGATIVE  Pregnancy, urine POC     Status: None   Collection Time: 12/24/17 10:06 AM  Result Value Ref Range  Preg Test, Ur NEGATIVE NEGATIVE  Wet prep, genital     Status: Abnormal   Collection Time: 12/24/17 10:44 AM  Result Value Ref Range   Yeast Wet Prep HPF POC NONE SEEN NONE SEEN   Trich, Wet Prep NONE SEEN NONE SEEN   Clue Cells Wet Prep HPF POC PRESENT (A) NONE SEEN   WBC, Wet Prep HPF POC FEW (A) NONE SEEN   Sperm NONE SEEN    Meds ordered this encounter  Medications  . metroNIDAZOLE (FLAGYL) 500 MG tablet    Sig: Take 1 tablet (500 mg total) by mouth 2 (two) times daily.    Dispense:  14 tablet    Refill:  0    Order Specific Question:   Supervising Provider    Answer:   Donnamae Jude [[id number]]  . Lactobacillus (ACIDOPHILUS PROBIOTIC) 10 MG CAPS    Sig: Take 1 tablet by mouth daily as needed.    Dispense:  30 capsule    Refill:  0    Order Specific Question:   Supervising Provider    Answer:   Merrily Pew    Assessment and Plan  --32 y.o. [phone] non-pregnant patient --Bacterial Vaginosis, rx as described above --Overdue for colpo/repeat Pap for LSIL results  in 2013 --Discharge home in stable condition  F/U: Per pt request, will message [place] to set up appt  Darlina Rumpf, CNM 12/24/2017, 12:47 PM

## 2017-12-24 NOTE — MAU Note (Signed)
Been experiencing abd pain.  Just got off her cycle.  Was using tampons, when she would make sudden move sitting to standing- she would have sharp pains in lower abd, or a pulling sensation from her vagina. Still having the pain occasionally. Just wanting to get checked while she has the time.

## 2017-12-27 LAB — GC/CHLAMYDIA PROBE AMP (~~LOC~~) NOT AT ARMC
Chlamydia: NEGATIVE
NEISSERIA GONORRHEA: NEGATIVE

## 2018-01-27 ENCOUNTER — Ambulatory Visit: Payer: BLUE CROSS/BLUE SHIELD | Admitting: Certified Nurse Midwife

## 2018-08-17 ENCOUNTER — Encounter: Payer: BLUE CROSS/BLUE SHIELD | Admitting: Family Medicine

## 2018-09-05 ENCOUNTER — Encounter: Payer: Self-pay | Admitting: Obstetrics & Gynecology

## 2018-09-05 ENCOUNTER — Other Ambulatory Visit: Payer: Self-pay

## 2018-09-05 ENCOUNTER — Ambulatory Visit (INDEPENDENT_AMBULATORY_CARE_PROVIDER_SITE_OTHER): Payer: BC Managed Care – PPO | Admitting: Obstetrics & Gynecology

## 2018-09-05 VITALS — BP 126/96 | HR 72 | Wt 217.6 lb

## 2018-09-05 DIAGNOSIS — Z113 Encounter for screening for infections with a predominantly sexual mode of transmission: Secondary | ICD-10-CM | POA: Diagnosis not present

## 2018-09-05 DIAGNOSIS — Z1151 Encounter for screening for human papillomavirus (HPV): Secondary | ICD-10-CM

## 2018-09-05 DIAGNOSIS — Z01419 Encounter for gynecological examination (general) (routine) without abnormal findings: Secondary | ICD-10-CM | POA: Diagnosis not present

## 2018-09-05 DIAGNOSIS — Z124 Encounter for screening for malignant neoplasm of cervix: Secondary | ICD-10-CM | POA: Diagnosis not present

## 2018-09-05 NOTE — Progress Notes (Signed)
STI screening with blood work Pap today  Tubal ligation 2013

## 2018-09-05 NOTE — Progress Notes (Signed)
Subjective:     Carrie Parker is a 33 y.o. female here for a routine exam.  Current complaints: well women with STI screen. No GYN complaints. Pt did now have insurance so did not f/u on prev abnormal PAP.      Gynecologic History Patient's last menstrual period was 08/22/2018. Contr3aception: tubal ligation Last Pap: 2013. Results were: abnormal- no follow up due to lack of insurance Last mammogram:n/a  Obstetric History OB History  Gravida Para Term Preterm AB Living  5 5 3 2  0 5  SAB TAB Ectopic Multiple Live Births  0 0 0 0 5    # Outcome Date GA Lbr Len/2nd Weight Sex Delivery Anes PTL Lv  5 Term 05/18/11 [redacted]w[redacted]d  7 lb 9 oz (3.43 kg) M CS-LTranv Spinal  LIV  4 Preterm           3 Preterm           2 Term           1 Term              The following portions of the patient's history were reviewed and updated as appropriate: allergies, current medications, past family history, past medical history, past social history, past surgical history and problem list.  Review of Systems Pertinent items are noted in HPI.    Objective:  BP (!) 126/96   Pulse 72   Wt 217 lb 9.6 oz (98.7 kg)   LMP 08/22/2018 Comment: tubal ligation   BMI 38.55 kg/m   General Appearance:    Alert, cooperative, no distress, appears stated age  Head:    Normocephalic, without obvious abnormality, atraumatic  Eyes:    conjunctiva/corneas clear, EOM's intact, both eyes  Ears:    Normal external ear canals, both ears  Nose:   Nares normal, septum midline, mucosa normal, no drainage    or sinus tenderness  Throat:   Lips, mucosa, and tongue normal; teeth and gums normal  Neck:   Supple, symmetrical, trachea midline, no adenopathy;    thyroid:  no enlargement/tenderness/nodules  Back:     Symmetric, no curvature, ROM normal, no CVA tenderness  Lungs:     respirations unlabored  Chest Wall:    No tenderness or deformity   Heart:    Regular rate and rhythm  Breast Exam:    No tenderness, masses, or nipple  abnormality  Abdomen:     Soft, non-tender, bowel sounds active all four quadrants,    no masses, no organomegaly  Genitalia:    Normal female without lesion, discharge or tenderness     Extremities:   Extremities normal, atraumatic, no cyanosis or edema  Pulses:   2+ and symmetric all extremities  Skin:   Skin color, texture, turgor normal, no rashes or lesions     Assessment:    Healthy female exam.   STI screen   Plan:   F/u PAP with hrHPV If PAP cont abnormal rec Colpo with bx F/u in 1 year or sooner prn  STI screen sx and labs.   Emmalin Jaquess L. Harraway-Smith, M.D., Cherlynn June

## 2018-09-06 LAB — HIV ANTIBODY (ROUTINE TESTING W REFLEX): HIV Screen 4th Generation wRfx: NONREACTIVE

## 2018-09-06 LAB — HEPATITIS C ANTIBODY: Hep C Virus Ab: <0.1 s/co ratio (ref 0.0–0.9)

## 2018-09-06 LAB — HEPATITIS B SURFACE ANTIBODY,QUALITATIVE: Hep B Surface Ab, Qual: REACTIVE

## 2018-09-06 LAB — RPR: RPR Ser Ql: NONREACTIVE

## 2018-09-07 LAB — GC/CHLAMYDIA PROBE AMP (~~LOC~~) NOT AT ARMC
Chlamydia: NEGATIVE
Neisseria Gonorrhea: NEGATIVE

## 2018-09-08 LAB — CYTOLOGY - PAP
Diagnosis: NEGATIVE
HPV: NOT DETECTED
Trichomonas: POSITIVE — AB

## 2018-09-19 ENCOUNTER — Other Ambulatory Visit: Payer: Self-pay

## 2018-09-19 MED ORDER — METRONIDAZOLE 500 MG PO TABS: ORAL_TABLET | ORAL | 0 refills | Status: DC

## 2018-09-19 NOTE — Telephone Encounter (Signed)
Patient test positive for Trich and she will need to be treated with Flagyl 2 grams PO 1. Call into [facility] on [address]. Patient will need to inform her partner they can be treated at the health department. Patient voice understanding at this time. No sex for at least 7-10 days.

## 2019-09-07 ENCOUNTER — Ambulatory Visit: Payer: BC Managed Care – PPO | Attending: Internal Medicine

## 2019-09-07 DIAGNOSIS — Z23 Encounter for immunization: Secondary | ICD-10-CM

## 2019-09-07 NOTE — Progress Notes (Signed)
° °  Covid-19 Vaccination Clinic  Name:  Carrie Parker    MRN: [id number] DOB: [birth year]  09/07/2019  [name] was observed post Covid-19 immunization for 15 minutes without incident. She was provided with Vaccine Information Sheet and instruction to access the V-Safe system.   [name] was instructed to call 911 with any severe reactions post vaccine:  Difficulty breathing   Swelling of face and throat   A fast heartbeat   A bad rash all over body   Dizziness and weakness   Immunizations Administered    Name Date Dose VIS Date Route   JANSSEN COVID-19 VACCINE 09/07/2019  3:34 PM 0.5 mL 05/06/2019 Intramuscular   Manufacturer: [name]   Lot: [id number]   [place]: [id number]

## 2020-07-05 ENCOUNTER — Ambulatory Visit: Payer: BC Managed Care – PPO | Admitting: Nurse Practitioner

## 2021-11-09 ENCOUNTER — Emergency Department (HOSPITAL_COMMUNITY)
Admission: EM | Admit: 2021-11-09 | Discharge: 2021-11-09 | Disposition: A | Discharge status: Home / Self Care | Payer: BC Managed Care – PPO | Attending: Emergency Medicine | Admitting: Emergency Medicine

## 2021-11-09 ENCOUNTER — Other Ambulatory Visit: Payer: Self-pay

## 2021-11-09 DIAGNOSIS — R112 Nausea with vomiting, unspecified: Secondary | ICD-10-CM | POA: Insufficient documentation

## 2021-11-09 DIAGNOSIS — F10920 Alcohol use, unspecified with intoxication, uncomplicated: Secondary | ICD-10-CM | POA: Insufficient documentation

## 2021-11-09 MED ORDER — ONDANSETRON 4 MG PO TBDP
4.0000 mg | ORAL_TABLET | Freq: Once | ORAL | Status: AC
  Administered 2021-11-09: 4 mg via ORAL
  Filled 2021-11-09: qty 1
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
## 2021-11-09 NOTE — ED Notes (Signed)
Patient drinking water and ambulating with a steady gait, called and talked to he daughter and they are on the way to pick he up

## 2021-11-13 ENCOUNTER — Encounter (HOSPITAL_COMMUNITY): Payer: Self-pay | Admitting: *Deleted

## 2021-11-13 ENCOUNTER — Other Ambulatory Visit: Payer: Self-pay

## 2021-11-13 ENCOUNTER — Emergency Department (HOSPITAL_COMMUNITY)
Admission: EM | Admit: 2021-11-13 | Discharge: 2021-11-14 | Disposition: A | Discharge status: Home / Self Care | Payer: No Typology Code available for payment source | Attending: Student | Admitting: Student

## 2021-11-13 DIAGNOSIS — M542 Cervicalgia: Secondary | ICD-10-CM | POA: Insufficient documentation

## 2021-11-13 DIAGNOSIS — R519 Headache, unspecified: Secondary | ICD-10-CM | POA: Insufficient documentation

## 2021-11-13 DIAGNOSIS — Y92410 Unspecified street and highway as the place of occurrence of the external cause: Secondary | ICD-10-CM | POA: Insufficient documentation

## 2021-11-13 DIAGNOSIS — Z5321 Procedure and treatment not carried out due to patient leaving prior to being seen by health care provider: Secondary | ICD-10-CM | POA: Insufficient documentation
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
## 2021-11-14 ENCOUNTER — Emergency Department (HOSPITAL_COMMUNITY): Payer: No Typology Code available for payment source

## 2021-11-14 NOTE — ED Notes (Signed)
Patient arrived in c-collar, neck cleared by Dr Sabra Heck.

## 2021-11-14 NOTE — ED Notes (Signed)
Pt called multiple times no answer 

## 2021-11-20 ENCOUNTER — Ambulatory Visit (INDEPENDENT_AMBULATORY_CARE_PROVIDER_SITE_OTHER): Payer: Medicaid Other

## 2021-11-20 ENCOUNTER — Encounter (HOSPITAL_COMMUNITY): Payer: Self-pay

## 2021-11-20 ENCOUNTER — Ambulatory Visit (HOSPITAL_COMMUNITY)
Admission: EM | Admit: 2021-11-20 | Discharge: 2021-11-20 | Disposition: A | Discharge status: Home / Self Care | Payer: Medicaid Other | Attending: Family Medicine | Admitting: Family Medicine

## 2021-11-20 DIAGNOSIS — M549 Dorsalgia, unspecified: Secondary | ICD-10-CM

## 2021-11-20 DIAGNOSIS — M546 Pain in thoracic spine: Secondary | ICD-10-CM

## 2021-11-20 DIAGNOSIS — M545 Low back pain, unspecified: Secondary | ICD-10-CM

## 2021-11-20 MED ORDER — IBUPROFEN 800 MG PO TABS: 800.0000 mg | ORAL_TABLET | Freq: Three times a day (TID) | ORAL | 0 refills | Status: AC | PRN

## 2021-11-20 MED ORDER — KETOROLAC TROMETHAMINE 30 MG/ML IJ SOLN
30.0000 mg | Freq: Once | INTRAMUSCULAR | Status: AC
  Administered 2021-11-20: 30 mg via INTRAMUSCULAR

## 2021-11-20 MED ORDER — KETOROLAC TROMETHAMINE 30 MG/ML IJ SOLN
INTRAMUSCULAR | Status: AC
  Filled 2021-11-20: qty 1

## 2021-11-20 MED ORDER — TIZANIDINE HCL 4 MG PO TABS: 4.0000 mg | ORAL_TABLET | Freq: Three times a day (TID) | ORAL | 0 refills | Status: AC | PRN
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
## 2021-11-20 NOTE — Discharge Instructions (Addendum)
Your x-rays were negative for any fracture  You have been given a shot of Toradol 30 mg today.  Take ibuprofen 800 mg--1 tab every 8 hours as needed for pain.   Take tizanidine 4 mg--1 every 8 hours as needed for muscle spasms  Heating pad may be relaxing to the sore muscles.  You can use the QR code/website at the back of the summary paperwork to schedule yourself a new patient appointment with primary care

## 2021-11-21 ENCOUNTER — Encounter: Payer: Self-pay | Admitting: Emergency Medicine
